# Patient Record
Sex: Female | Born: 1970 | Race: White | Hispanic: No | Marital: Married | Smoking: Never smoker
Health system: Southern US, Community
[De-identification: ages and names within clinical notes are randomized; demographics above are authoritative.]

## PROBLEM LIST (undated history)

## (undated) DIAGNOSIS — Z8489 Family history of other specified conditions: Secondary | ICD-10-CM

## (undated) DIAGNOSIS — J9859 Other diseases of mediastinum, not elsewhere classified: Secondary | ICD-10-CM

## (undated) DIAGNOSIS — M722 Plantar fascial fibromatosis: Secondary | ICD-10-CM

## (undated) DIAGNOSIS — I749 Embolism and thrombosis of unspecified artery: Secondary | ICD-10-CM

## (undated) DIAGNOSIS — L719 Rosacea, unspecified: Secondary | ICD-10-CM

## (undated) DIAGNOSIS — S62109A Fracture of unspecified carpal bone, unspecified wrist, initial encounter for closed fracture: Secondary | ICD-10-CM

## (undated) DIAGNOSIS — H8110 Benign paroxysmal vertigo, unspecified ear: Secondary | ICD-10-CM

## (undated) HISTORY — DX: Embolism and thrombosis of unspecified artery: I74.9

## (undated) HISTORY — PX: WISDOM TOOTH EXTRACTION: SHX21

## (undated) HISTORY — DX: Benign paroxysmal vertigo, unspecified ear: H81.10

## (undated) HISTORY — DX: Other diseases of mediastinum, not elsewhere classified: J98.59

## (undated) HISTORY — DX: Fracture of unspecified carpal bone, unspecified wrist, initial encounter for closed fracture: S62.109A

---

## 1998-04-23 ENCOUNTER — Inpatient Hospital Stay (HOSPITAL_COMMUNITY): Admission: AD | Admit: 1998-04-23 | Discharge: 1998-04-26 | Payer: Self-pay | Admitting: Obstetrics and Gynecology

## 2000-06-10 ENCOUNTER — Other Ambulatory Visit: Admission: RE | Admit: 2000-06-10 | Discharge: 2000-06-10 | Payer: Self-pay | Admitting: Obstetrics and Gynecology

## 2001-01-07 ENCOUNTER — Inpatient Hospital Stay (HOSPITAL_COMMUNITY): Admission: AD | Admit: 2001-01-07 | Discharge: 2001-01-10 | Payer: Self-pay | Admitting: Obstetrics and Gynecology

## 2001-02-10 ENCOUNTER — Other Ambulatory Visit: Admission: RE | Admit: 2001-02-10 | Discharge: 2001-02-10 | Payer: Self-pay | Admitting: Obstetrics and Gynecology

## 2003-03-16 ENCOUNTER — Other Ambulatory Visit: Admission: RE | Admit: 2003-03-16 | Discharge: 2003-03-16 | Payer: Self-pay | Admitting: Obstetrics & Gynecology

## 2006-07-10 ENCOUNTER — Encounter: Admission: RE | Admit: 2006-07-10 | Discharge: 2006-07-10 | Payer: Self-pay | Admitting: Internal Medicine

## 2010-05-10 ENCOUNTER — Encounter: Admission: RE | Admit: 2010-05-10 | Discharge: 2010-05-10 | Payer: Self-pay | Admitting: Internal Medicine

## 2014-08-29 ENCOUNTER — Other Ambulatory Visit (HOSPITAL_COMMUNITY): Payer: Self-pay | Admitting: Otolaryngology

## 2014-08-29 ENCOUNTER — Ambulatory Visit (HOSPITAL_COMMUNITY): Payer: Self-pay

## 2014-08-29 DIAGNOSIS — R52 Pain, unspecified: Secondary | ICD-10-CM

## 2015-06-26 ENCOUNTER — Other Ambulatory Visit: Payer: Self-pay | Admitting: Internal Medicine

## 2015-06-26 ENCOUNTER — Ambulatory Visit (HOSPITAL_COMMUNITY)
Admission: RE | Admit: 2015-06-26 | Discharge: 2015-06-26 | Disposition: A | Discharge status: Home / Self Care | Payer: 59 | Source: Ambulatory Visit | Attending: Surgery | Admitting: Surgery

## 2015-06-26 ENCOUNTER — Other Ambulatory Visit: Payer: Self-pay | Admitting: Surgery

## 2015-06-26 ENCOUNTER — Other Ambulatory Visit (HOSPITAL_COMMUNITY): Payer: Self-pay | Admitting: Internal Medicine

## 2015-06-26 DIAGNOSIS — I998 Other disorder of circulatory system: Secondary | ICD-10-CM

## 2015-06-26 DIAGNOSIS — I999 Unspecified disorder of circulatory system: Secondary | ICD-10-CM | POA: Diagnosis not present

## 2015-06-26 DIAGNOSIS — I744 Embolism and thrombosis of arteries of extremities, unspecified: Secondary | ICD-10-CM

## 2015-06-27 ENCOUNTER — Inpatient Hospital Stay
Admission: RE | Admit: 2015-06-27 | Discharge: 2015-06-27 | Disposition: A | Discharge status: Home / Self Care | Payer: 59 | Source: Ambulatory Visit | Attending: Internal Medicine | Admitting: Internal Medicine

## 2015-06-28 ENCOUNTER — Encounter: Payer: Self-pay | Admitting: Podiatry

## 2015-06-28 ENCOUNTER — Ambulatory Visit (INDEPENDENT_AMBULATORY_CARE_PROVIDER_SITE_OTHER): Payer: 59 | Admitting: Podiatry

## 2015-06-28 ENCOUNTER — Ambulatory Visit (INDEPENDENT_AMBULATORY_CARE_PROVIDER_SITE_OTHER): Payer: 59

## 2015-06-28 VITALS — BP 115/81 | HR 72 | Resp 16 | Ht 67.0 in | Wt 195.0 lb

## 2015-06-28 DIAGNOSIS — M722 Plantar fascial fibromatosis: Secondary | ICD-10-CM | POA: Diagnosis not present

## 2015-06-28 DIAGNOSIS — M79672 Pain in left foot: Secondary | ICD-10-CM

## 2015-06-28 DIAGNOSIS — M2010 Hallux valgus (acquired), unspecified foot: Secondary | ICD-10-CM | POA: Diagnosis not present

## 2015-06-28 DIAGNOSIS — Z0189 Encounter for other specified special examinations: Secondary | ICD-10-CM

## 2015-06-28 MED ORDER — TRIAMCINOLONE ACETONIDE 10 MG/ML IJ SUSP
10.0000 mg | Freq: Once | INTRAMUSCULAR | Status: AC
  Administered 2015-06-28: 10 mg

## 2015-06-28 MED ORDER — DICLOFENAC SODIUM 75 MG PO TBEC: 75.0000 mg | DELAYED_RELEASE_TABLET | Freq: Two times a day (BID) | ORAL | Status: DC

## 2015-06-28 NOTE — Progress Notes (Signed)
   Subjective:    Patient ID: Erica Browning, female    DOB: 11/26/1970, 44 y.o.   MRN: [id number]  HPI Patient presents with foot pain in their left foot, heel and arch. This has been going on for the past 3-4 weeks.  Patient also presents with needing new orthotics. Patient's old ones are worn out.    Review of Systems  All other systems reviewed and are negative.      Objective:   Physical Exam        Assessment & Plan:

## 2015-06-28 NOTE — Progress Notes (Signed)
Subjective:     Patient ID: Erica Browning, female   DOB: Feb 12, 1971, 44 y.o.   MRN: [id number]  HPI patient states that my left heel has been killing me for the last month and I admit I was very active and I did not wear proper shoes and I do have structural bunions bilateral but they're not bothering me significantly   Review of Systems  All other systems reviewed and are negative.      Objective:   Physical Exam  Constitutional: She is oriented to person, place, and time.  Cardiovascular: Intact distal pulses.   Musculoskeletal: Normal range of motion.  Neurological: She is oriented to person, place, and time.  Skin: Skin is warm.  Nursing note and vitals reviewed.  neurovascular status intact muscle strength adequate range of motion within normal limits with patient noted to have exquisite discomfort plantar aspect left heel at the insertional point tendon the calcaneus with inflammation and moderate depression of the arch. 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 At this point were to focus on the structural plantar fasciitis and I injected the plantar fascia 3 Milligan Kenalog 5 mill grams Xylocaine placed in fascially brace placed on for tear and 75 mg twice a day and instructed on physical therapy. We will schedule her for new orthotics but it next visit after I get the heel pain under control

## 2015-06-28 NOTE — Patient Instructions (Signed)

## 2015-06-29 ENCOUNTER — Other Ambulatory Visit: Payer: Self-pay

## 2015-06-29 ENCOUNTER — Other Ambulatory Visit (HOSPITAL_COMMUNITY): Payer: Self-pay | Admitting: Internal Medicine

## 2015-06-29 ENCOUNTER — Ambulatory Visit (HOSPITAL_COMMUNITY): Payer: 59 | Attending: Cardiology

## 2015-06-29 DIAGNOSIS — I744 Embolism and thrombosis of arteries of extremities, unspecified: Secondary | ICD-10-CM

## 2015-07-05 ENCOUNTER — Ambulatory Visit (INDEPENDENT_AMBULATORY_CARE_PROVIDER_SITE_OTHER): Payer: 59 | Admitting: Podiatry

## 2015-07-05 ENCOUNTER — Encounter: Payer: Self-pay | Admitting: Thoracic Surgery (Cardiothoracic Vascular Surgery)

## 2015-07-05 ENCOUNTER — Institutional Professional Consult (permissible substitution) (INDEPENDENT_AMBULATORY_CARE_PROVIDER_SITE_OTHER): Payer: 59 | Admitting: Thoracic Surgery (Cardiothoracic Vascular Surgery)

## 2015-07-05 VITALS — BP 145/90 | HR 74 | Resp 20 | Ht 67.0 in | Wt 195.0 lb

## 2015-07-05 VITALS — BP 119/66 | HR 72 | Resp 16

## 2015-07-05 DIAGNOSIS — D383 Neoplasm of uncertain behavior of mediastinum: Secondary | ICD-10-CM | POA: Diagnosis not present

## 2015-07-05 DIAGNOSIS — D4989 Neoplasm of unspecified behavior of other specified sites: Secondary | ICD-10-CM

## 2015-07-05 DIAGNOSIS — M2010 Hallux valgus (acquired), unspecified foot: Secondary | ICD-10-CM | POA: Diagnosis not present

## 2015-07-05 DIAGNOSIS — J9859 Other diseases of mediastinum, not elsewhere classified: Secondary | ICD-10-CM | POA: Insufficient documentation

## 2015-07-05 DIAGNOSIS — M722 Plantar fascial fibromatosis: Secondary | ICD-10-CM

## 2015-07-05 NOTE — Progress Notes (Signed)
PCP is Marton Redwood, MD Referring Provider is Marton Redwood, MD  Chief Complaint  Patient presents with  . Lung Mass    Surgical eval on lung mass, CTA Chest 06/28/15 @ [facility]    HPI: 44 year old woman sent for consultation regarding an anterior mediastinal mass.   Erica Browning is a 44 year old mother of 2 with no significant past medical history. She is very active and plays tennis on regular basis. About 2 weeks ago she had an episode where she noted that her right ring finger was blue in color and numb. This happened to her 2 nights in a row. She saw Dr. Brigitte Pulse who I did a workup on her including vascular studies which showed some compromise of flow. 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  In retrospect she says she has been having some chest discomfort. This is often positional, but never exertional. She describes it as a tight or stretching feeling in the chest. This is come and gone over the past couple of years. She is not having any exertional chest tightness, pressure or pain. She does not have any shortness of breath or wheezing. She has not had any weight loss.   Past Medical History  Diagnosis Date  . Mediastinal mass   . BPPV (benign paroxysmal positional vertigo)   . Fracture of wrist     as a child  . Arterial thrombosis     No past surgical history on file.  No family history on file. family history significant for cancer in mother and uncle.  Social History Social History  Substance Use Topics  . Smoking status: Never Smoker   . Smokeless tobacco: None  . Alcohol Use: 0.0 oz/week    0 Standard drinks or equivalent per week     Comment: social    Current Outpatient Prescriptions  Medication Sig Dispense Refill  . aspirin 81 MG tablet Take 81 mg by mouth daily.    . diclofenac (VOLTAREN) 75 MG EC tablet Take 1 tablet (75 mg total) by mouth 2 (two) times daily.  50 tablet 2  . ferrous sulfate 325 (65 FE) MG tablet Take 325 mg by mouth daily with breakfast.    . METRONIDAZOLE, TOPICAL, 0.75 % LOTN apply to [facility] once daily as directed  0  . Sulfacetamide Sodium-Sulfur 9.8-4.8 % LIQD   0   No current facility-administered medications for this visit.    No Known Allergies  Review of Systems  Constitutional: Negative for fever, chills, diaphoresis, activity change, appetite change, fatigue and unexpected weight change.  Eyes: Negative.   Respiratory: Negative for shortness of breath and wheezing.   Cardiovascular: Positive for chest pain (vague left anterior). Negative for leg swelling.  Gastrointestinal: Negative for abdominal pain and blood in stool.  Genitourinary: Positive for menstrual problem (heavy menses). Negative for hematuria and difficulty urinating.  Musculoskeletal: Negative for arthralgias, gait problem and neck pain.  Skin: Positive for color change (left ring finger "blue" resolved).  Neurological: Positive for numbness (ring finger right hand for 2 nights last week). Negative for dizziness and syncope.  Hematological: Negative for adenopathy. Does not bruise/bleed easily.    BP 145/90 mmHg  Pulse 74  Resp 20  Ht 5\' 7"  (1.702 m)  Wt 195 lb (88.451 kg)  BMI 30.53 kg/m2  SpO2 98%  LMP 06/07/2015 Physical Exam  Constitutional: She is oriented to person,  place, and time. She appears well-developed and well-nourished. No distress.  HENT:  Head: Normocephalic and atraumatic.  Eyes: Conjunctivae and EOM are normal. No scleral icterus.  Neck: Neck supple. No thyromegaly present.  Cardiovascular: Normal rate, regular rhythm and intact distal pulses.  Exam reveals no gallop and no friction rub.   No murmur heard. Pulmonary/Chest: Effort normal and breath sounds normal. No respiratory distress. She has no wheezes. She has no rales.  Abdominal: Soft. Bowel sounds are normal. She exhibits no distension. There is no tenderness.   Musculoskeletal: Normal range of motion. She exhibits no edema or tenderness.  Lymphadenopathy:    She has no cervical adenopathy.  Neurological: She is alert and oriented to person, place, and time. No cranial nerve deficit. She exhibits normal muscle tone. Coordination normal.  Skin: Skin is warm and dry. No rash noted. No erythema.  Vitals reviewed.    Diagnostic Tests:  Beta hCG < 1 Alpha-fetoprotein = 11.9 (0.0-8.3)  CT chest [facility] 06/28/2015 Impression No pulmonary embolus. Dominantly fatty lesion in the left anterior mediastinum was some wispy soft tissue component, they represented teratoma. Differential considerations include lipoma/liposarcoma and thymoma lipoma. Axial images of the right upper extremity were obtained. The study was not tightened appropriately to evaluate the arterial structures of the arm.  Echocardiogram Study conclusions Left ventricle: Cavity size was normal. Systolic function was normal. The estimated ejection fraction was in the range of 60-65%. Wall motion was normal; there were no regional wall motion abnormalities. Left trigger diastolic function parameters were normal. Impressions No cardiac source of emboli was identified.  Vascular study Decreased phasicity in the right fourth finger in comparison to the left suggestive of a possible ischemic finger.  I personally reviewed the CT chest images and lab results. I concur with the radiologist's findings regarding the intermediate style mass. 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 Very slightly elevated alpha-fetoprotein levels are also consistent with teratoma.  Other possible things in the differential diagnosis include seminomatous or nonseminomatous germ cell tumors, lipoma, liposarcoma, thymoma. These are all much less likely than teratoma. I  discussed the differential diagnosis with Erica Browning and we reviewed the films together.  I recommended that we resect this mass. This would best be done through a left VATS approach. The primary limiting factor is going to be getting the mass out of the chest, but otherwise we should be able to do the procedure through a relatively small incision. I reviewed the general nature of the procedure with Erica Browning and her husband. We discussed the need for general anesthesia, the incisions to be used, the expected hospital stay, and the overall recovery. I reviewed the indications, risks, benefits, and alternatives. She understands the risks include, but are not limited to death (extremely unlikely), MI, DVT, PE, bleeding, possible need for transfusion, infection, phrenic nerve injury with diaphragmatic dysfunction, and chronic pain or paresthesias. They also understand there is a possibility of other unforeseeable complications. Overall she is a very low risk patient.  Plan: Left VATS, resection anterior mediastinal mass, On-Q local anesthetic catheter placement.  She will call to schedule.  I spent 45 minutes with Erica Browning during this visit, greater than 50% was spent in counseling.  [name], MD [facility] [phone]

## 2015-07-05 NOTE — Progress Notes (Signed)
Subjective:     Patient ID: Erica Browning, female   DOB: Dec 21, 1970, 44 y.o.   MRN: [id number]  HPI patient states she played tennis yesterday and her heel really started to hurt her but it's been some better over this week. Worse in the morning after sitting and she is due today to see a cardio surgeon for a mass around her heart   Review of Systems     Objective:   Physical Exam    neurovascular status intact with patient noted to have continued discomfort plantar aspect left heel insertional point tendon the calcaneus with moderate depression of the arch noted  Assessment:     Continued plantar fasciitis left acute nature especially after periods of sitting or after sleeping    Plan:     Reviewed condition and did dispensed night splint as were not to do an injection today until her other areas are evaluated thoroughly. I also scanned for custom orthotics for the long-term for this patient and she'll be seen back when they're ready or earlier if any issues should occur

## 2015-07-06 ENCOUNTER — Other Ambulatory Visit: Payer: Self-pay | Admitting: *Deleted

## 2015-07-06 DIAGNOSIS — J9859 Other diseases of mediastinum, not elsewhere classified: Secondary | ICD-10-CM

## 2015-07-19 NOTE — Pre-Procedure Instructions (Signed)
Erica Browning  07/19/2015     Your procedure is scheduled on : Monday July 24, 2015 at 7:30 AM.  Report to [facility] Admitting at 5:30 A.M.  Call this number if you have problems the morning of surgery: [phone]   Remember:  Do not eat food or drink liquids after midnight.  Take these medicines the morning of surgery with A SIP OF WATER: NONE   Please stop taking any vitamins, herbal medications, Diclofenac/Voltaren, Ibuprofen, Advil, Motrin, Aleve, etc   Please bring your advanced directive the day of surgery    Do not wear jewelry, make-up or nail polish.  Do not wear lotions, powders, perfumes, or deodorant.    Do not shave 48 hours prior to surgery.   Do not bring valuables to the hospital.  [facility] is not responsible for any belongings or valuables.  Contacts, dentures or bridgework may not be worn into surgery.  Leave your suitcase in the car.  After surgery it may be brought to your room.  For patients admitted to the hospital, discharge time will be determined by your treatment team.  Patients discharged the day of surgery will not be allowed to drive home.   Name and phone number of your driver:    Special instructions:  Shower using CHG soap the night before and the morning of your surgery  Please read over the following fact sheets that you were given. Pain Booklet, Coughing and Deep Breathing, Blood Transfusion Information, MRSA Information and Surgical Site Infection Prevention

## 2015-07-20 ENCOUNTER — Encounter (HOSPITAL_COMMUNITY)
Admission: RE | Admit: 2015-07-20 | Discharge: 2015-07-20 | Disposition: A | Discharge status: Home / Self Care | Payer: 59 | Source: Ambulatory Visit | Attending: Thoracic Surgery (Cardiothoracic Vascular Surgery) | Admitting: Thoracic Surgery (Cardiothoracic Vascular Surgery)

## 2015-07-20 ENCOUNTER — Encounter (HOSPITAL_COMMUNITY): Payer: Self-pay

## 2015-07-20 ENCOUNTER — Ambulatory Visit: Payer: 59 | Admitting: *Deleted

## 2015-07-20 ENCOUNTER — Other Ambulatory Visit: Payer: Self-pay

## 2015-07-20 VITALS — BP 130/80 | HR 70 | Temp 98.0°F | Resp 20 | Ht 67.0 in | Wt 195.8 lb

## 2015-07-20 DIAGNOSIS — Z01812 Encounter for preprocedural laboratory examination: Secondary | ICD-10-CM | POA: Diagnosis not present

## 2015-07-20 DIAGNOSIS — Z7901 Long term (current) use of anticoagulants: Secondary | ICD-10-CM | POA: Insufficient documentation

## 2015-07-20 DIAGNOSIS — Z01818 Encounter for other preprocedural examination: Secondary | ICD-10-CM | POA: Insufficient documentation

## 2015-07-20 DIAGNOSIS — R222 Localized swelling, mass and lump, trunk: Secondary | ICD-10-CM | POA: Insufficient documentation

## 2015-07-20 DIAGNOSIS — Z79899 Other long term (current) drug therapy: Secondary | ICD-10-CM | POA: Diagnosis not present

## 2015-07-20 DIAGNOSIS — M722 Plantar fascial fibromatosis: Secondary | ICD-10-CM

## 2015-07-20 DIAGNOSIS — J9859 Other diseases of mediastinum, not elsewhere classified: Secondary | ICD-10-CM

## 2015-07-20 DIAGNOSIS — Z0183 Encounter for blood typing: Secondary | ICD-10-CM | POA: Diagnosis not present

## 2015-07-20 DIAGNOSIS — R7989 Other specified abnormal findings of blood chemistry: Secondary | ICD-10-CM | POA: Insufficient documentation

## 2015-07-20 HISTORY — DX: Family history of other specified conditions: Z84.89

## 2015-07-20 HISTORY — DX: Plantar fascial fibromatosis: M72.2

## 2015-07-20 HISTORY — DX: Rosacea, unspecified: L71.9

## 2015-07-20 LAB — BLOOD GAS, ARTERIAL
ACID-BASE DEFICIT: 0.8 mmol/L (ref 0.0–2.0)
Bicarbonate: 22.7 mEq/L (ref 20.0–24.0)
Drawn by: 206361
FIO2: 0.21
O2 Saturation: 97.7 %
PCO2 ART: 33.7 mmHg — AB (ref 35.0–45.0)
PO2 ART: 97.2 mmHg (ref 80.0–100.0)
Patient temperature: 98.6
TCO2: 23.7 mmol/L (ref 0–100)
pH, Arterial: 7.444 (ref 7.350–7.450)

## 2015-07-20 LAB — CBC
HCT: 40 % (ref 36.0–46.0)
HEMOGLOBIN: 13.6 g/dL (ref 12.0–15.0)
MCH: 30.5 pg (ref 26.0–34.0)
MCHC: 34 g/dL (ref 30.0–36.0)
MCV: 89.7 fL (ref 78.0–100.0)
PLATELETS: 257 10*3/uL (ref 150–400)
RBC: 4.46 MIL/uL (ref 3.87–5.11)
RDW: 13.2 % (ref 11.5–15.5)
WBC: 4.5 10*3/uL (ref 4.0–10.5)

## 2015-07-20 LAB — COMPREHENSIVE METABOLIC PANEL
ALK PHOS: 70 U/L (ref 38–126)
ALT: 165 U/L — AB (ref 14–54)
ANION GAP: 6 (ref 5–15)
AST: 136 U/L — ABNORMAL HIGH (ref 15–41)
Albumin: 3.6 g/dL (ref 3.5–5.0)
BUN: 9 mg/dL (ref 6–20)
CHLORIDE: 108 mmol/L (ref 101–111)
CO2: 23 mmol/L (ref 22–32)
Calcium: 9.1 mg/dL (ref 8.9–10.3)
Creatinine, Ser: 0.6 mg/dL (ref 0.44–1.00)
GLUCOSE: 93 mg/dL (ref 65–99)
POTASSIUM: 4 mmol/L (ref 3.5–5.1)
Sodium: 137 mmol/L (ref 135–145)
Total Bilirubin: 0.3 mg/dL (ref 0.3–1.2)
Total Protein: 7 g/dL (ref 6.5–8.1)

## 2015-07-20 LAB — URINALYSIS, ROUTINE W REFLEX MICROSCOPIC
BILIRUBIN URINE: NEGATIVE
Glucose, UA: NEGATIVE mg/dL
HGB URINE DIPSTICK: NEGATIVE
KETONES UR: NEGATIVE mg/dL
NITRITE: NEGATIVE
PROTEIN: NEGATIVE mg/dL
SPECIFIC GRAVITY, URINE: 1.011 (ref 1.005–1.030)
UROBILINOGEN UA: 0.2 mg/dL (ref 0.0–1.0)
pH: 6.5 (ref 5.0–8.0)

## 2015-07-20 LAB — URINE MICROSCOPIC-ADD ON

## 2015-07-20 LAB — PROTIME-INR
INR: 1.03 (ref 0.00–1.49)
Prothrombin Time: 13.7 seconds (ref 11.6–15.2)

## 2015-07-20 LAB — SURGICAL PCR SCREEN
MRSA, PCR: NEGATIVE
Staphylococcus aureus: NEGATIVE

## 2015-07-20 LAB — ABO/RH: ABO/RH(D): O POS

## 2015-07-20 LAB — TYPE AND SCREEN
ABO/RH(D): O POS
ANTIBODY SCREEN: NEGATIVE

## 2015-07-20 LAB — APTT: APTT: 54 s — AB (ref 24–37)

## 2015-07-20 LAB — HCG, SERUM, QUALITATIVE: PREG SERUM: NEGATIVE

## 2015-07-20 NOTE — Progress Notes (Signed)
Patient ID: Erica Browning, female   DOB: 1970-12-17, 44 y.o.   MRN: [id number] Patient presents for orthotic pick up.  Verbal and written break in and wear instructions given.  Patient will follow up in 4 weeks if symptoms worsen or fail to improve.

## 2015-07-20 NOTE — Progress Notes (Signed)
Anesthesia Chart Review:  Pt is 44 year old female scheduled for video assisted thoracoscopy, resection of mediastinal mass (L chest) on 07/24/2015 with Dr. Roxan Hockey.   PMH includes: BPPV, arterial thrombosis. Never smoker. BMI 31.   Medications include: lovenox, voltaren  Preoperative labs reviewed.  PTT 54. AST 136, ALT 165.   EKG 07/20/2015: NSR. Possible LAE.   Echo 06/29/2015:  - Left ventricle: The cavity size was normal. Systolic function was normal. The estimated ejection fraction was in the range of 60% to 65%. Wall motion was normal; there were no regional wall motion abnormalities. Left ventricular diastolic function parameters were normal. -Impressions: No cardiac source of emboli was indentified.  Reviewed case with Dr. Tobias Alexander. Will repeat LFTs DOS. Left voicemail for Ryan in Dr. Leonarda Salon office to let them know about the elevated LFTs and PTT.  If labs acceptable DOS, I anticipate pt can proceed as scheduled.   Willeen Cass, FNP-BC [name]/Anesthesiology Phone: [phone] 07/20/2015 3:24 PM

## 2015-07-20 NOTE — Progress Notes (Signed)
PCP is Marton Redwood  Patient informed Nurse that she started Lovenox injections on 07/13/15 BID. Patient voiced concern about if she needed to stop taking Lovenox before surgery. Nurse called Thurmond Butts at Dr. Leonarda Salon office and Thurmond Butts stated she would inform Dr. Roxan Hockey of this and she would call patient today and let her know if/when she needed to stop taking Lovenox injections. Patient informed of this and verbalized understanding.

## 2015-07-20 NOTE — Patient Instructions (Signed)

## 2015-07-24 ENCOUNTER — Inpatient Hospital Stay (HOSPITAL_COMMUNITY): Payer: 59

## 2015-07-24 ENCOUNTER — Encounter (HOSPITAL_COMMUNITY): Payer: Self-pay | Admitting: Certified Registered"

## 2015-07-24 ENCOUNTER — Inpatient Hospital Stay (HOSPITAL_COMMUNITY)
Admission: RE | Admit: 2015-07-24 | Discharge: 2015-07-28 | DRG: 164 | Disposition: A | Discharge status: Home / Self Care | Payer: 59 | Source: Ambulatory Visit | Attending: Thoracic Surgery (Cardiothoracic Vascular Surgery) | Admitting: Thoracic Surgery (Cardiothoracic Vascular Surgery)

## 2015-07-24 ENCOUNTER — Inpatient Hospital Stay (HOSPITAL_COMMUNITY): Payer: 59 | Admitting: Anesthesiology

## 2015-07-24 ENCOUNTER — Encounter (HOSPITAL_COMMUNITY)
Admission: RE | Disposition: A | Discharge status: Home / Self Care | Payer: Self-pay | Source: Ambulatory Visit | Attending: Thoracic Surgery (Cardiothoracic Vascular Surgery)

## 2015-07-24 ENCOUNTER — Inpatient Hospital Stay (HOSPITAL_COMMUNITY): Payer: 59 | Admitting: Emergency Medicine

## 2015-07-24 DIAGNOSIS — J9859 Other diseases of mediastinum, not elsewhere classified: Secondary | ICD-10-CM | POA: Diagnosis present

## 2015-07-24 DIAGNOSIS — Z01818 Encounter for other preprocedural examination: Secondary | ICD-10-CM

## 2015-07-24 DIAGNOSIS — Z4682 Encounter for fitting and adjustment of non-vascular catheter: Secondary | ICD-10-CM

## 2015-07-24 DIAGNOSIS — Z9889 Other specified postprocedural states: Secondary | ICD-10-CM

## 2015-07-24 DIAGNOSIS — Z7982 Long term (current) use of aspirin: Secondary | ICD-10-CM

## 2015-07-24 DIAGNOSIS — R222 Localized swelling, mass and lump, trunk: Secondary | ICD-10-CM | POA: Diagnosis present

## 2015-07-24 DIAGNOSIS — D489 Neoplasm of uncertain behavior, unspecified: Secondary | ICD-10-CM

## 2015-07-24 DIAGNOSIS — D62 Acute posthemorrhagic anemia: Secondary | ICD-10-CM | POA: Diagnosis not present

## 2015-07-24 DIAGNOSIS — D152 Benign neoplasm of mediastinum: Secondary | ICD-10-CM | POA: Diagnosis present

## 2015-07-24 DIAGNOSIS — E876 Hypokalemia: Secondary | ICD-10-CM | POA: Diagnosis not present

## 2015-07-24 DIAGNOSIS — K5900 Constipation, unspecified: Secondary | ICD-10-CM | POA: Diagnosis not present

## 2015-07-24 DIAGNOSIS — D4989 Neoplasm of unspecified behavior of other specified sites: Secondary | ICD-10-CM

## 2015-07-24 HISTORY — PX: RESECTION OF MEDIASTINAL MASS: SHX6497

## 2015-07-24 HISTORY — PX: VIDEO ASSISTED THORACOSCOPY: SHX5073

## 2015-07-24 LAB — HEPATIC FUNCTION PANEL
ALBUMIN: 3.7 g/dL (ref 3.5–5.0)
ALK PHOS: 82 U/L (ref 38–126)
ALT: 189 U/L — ABNORMAL HIGH (ref 14–54)
AST: 80 U/L — ABNORMAL HIGH (ref 15–41)
BILIRUBIN TOTAL: 0.7 mg/dL (ref 0.3–1.2)
Bilirubin, Direct: <0.1 mg/dL — ABNORMAL LOW (ref 0.1–0.5)
TOTAL PROTEIN: 7.3 g/dL (ref 6.5–8.1)

## 2015-07-24 LAB — APTT: APTT: 34 s (ref 24–37)

## 2015-07-24 SURGERY — VIDEO ASSISTED THORACOSCOPY
Anesthesia: General | Site: Chest

## 2015-07-24 MED ORDER — DICLOFENAC SODIUM 75 MG PO TBEC
75.0000 mg | DELAYED_RELEASE_TABLET | Freq: Two times a day (BID) | ORAL | Status: DC
  Filled 2015-07-24 (×2): qty 1

## 2015-07-24 MED ORDER — SUCCINYLCHOLINE CHLORIDE 20 MG/ML IJ SOLN
INTRAMUSCULAR | Status: DC | PRN
  Administered 2015-07-24: 60 mg via INTRAVENOUS

## 2015-07-24 MED ORDER — OXYCODONE HCL 5 MG PO TABS: 5.0000 mg | ORAL_TABLET | Freq: Once | ORAL | Status: DC | PRN

## 2015-07-24 MED ORDER — DEXTROSE 5 % IV SOLN
1.5000 g | Freq: Two times a day (BID) | INTRAVENOUS | Status: AC
  Administered 2015-07-24 – 2015-07-25 (×2): 1.5 g via INTRAVENOUS
  Filled 2015-07-24 (×2): qty 1.5

## 2015-07-24 MED ORDER — SENNOSIDES-DOCUSATE SODIUM 8.6-50 MG PO TABS
1.0000 | ORAL_TABLET | Freq: Every day | ORAL | Status: DC
  Administered 2015-07-24 – 2015-07-27 (×4): 1 via ORAL
  Filled 2015-07-24 (×5): qty 1

## 2015-07-24 MED ORDER — VECURONIUM BROMIDE 10 MG IV SOLR
INTRAVENOUS | Status: DC | PRN
  Administered 2015-07-24 (×2): 1 mg via INTRAVENOUS
  Administered 2015-07-24: 2 mg via INTRAVENOUS

## 2015-07-24 MED ORDER — FENTANYL 10 MCG/ML IV SOLN
INTRAVENOUS | Status: DC
  Administered 2015-07-24: 15:00:00 via INTRAVENOUS
  Administered 2015-07-24: 255 ug via INTRAVENOUS
  Administered 2015-07-24: 105 ug via INTRAVENOUS
  Administered 2015-07-24: 120 ug via INTRAVENOUS
  Administered 2015-07-24: 23:00:00 via INTRAVENOUS
  Administered 2015-07-25: 165 ug via INTRAVENOUS
  Administered 2015-07-25: 180 ug via INTRAVENOUS
  Administered 2015-07-25 (×3): 90 ug via INTRAVENOUS
  Administered 2015-07-26: 0 ug via INTRAVENOUS
  Administered 2015-07-26: 1 ug via INTRAVENOUS
  Administered 2015-07-26: 30 ug via INTRAVENOUS
  Administered 2015-07-27: 0 ug via INTRAVENOUS
  Filled 2015-07-24 (×3): qty 50

## 2015-07-24 MED ORDER — DIPHENHYDRAMINE HCL 12.5 MG/5ML PO ELIX
12.5000 mg | ORAL_SOLUTION | Freq: Four times a day (QID) | ORAL | Status: DC | PRN
  Filled 2015-07-24: qty 5

## 2015-07-24 MED ORDER — SODIUM CHLORIDE 0.9 % IJ SOLN
INTRAMUSCULAR | Status: AC
  Filled 2015-07-24: qty 10

## 2015-07-24 MED ORDER — HYDROMORPHONE HCL 1 MG/ML IJ SOLN
INTRAMUSCULAR | Status: AC
  Administered 2015-07-24: 0.5 mg via INTRAVENOUS
  Filled 2015-07-24: qty 1

## 2015-07-24 MED ORDER — MIDAZOLAM HCL 2 MG/2ML IJ SOLN
INTRAMUSCULAR | Status: AC
  Filled 2015-07-24: qty 4

## 2015-07-24 MED ORDER — ONDANSETRON HCL 4 MG/2ML IJ SOLN: 4.0000 mg | Freq: Four times a day (QID) | INTRAMUSCULAR | Status: DC | PRN

## 2015-07-24 MED ORDER — GLYCOPYRROLATE 0.2 MG/ML IJ SOLN
INTRAMUSCULAR | Status: DC | PRN
  Administered 2015-07-24: .6 mg via INTRAVENOUS

## 2015-07-24 MED ORDER — SODIUM CHLORIDE 0.9 % IJ SOLN: 9.0000 mL | INTRAMUSCULAR | Status: DC | PRN

## 2015-07-24 MED ORDER — NEOSTIGMINE METHYLSULFATE 10 MG/10ML IV SOLN
INTRAVENOUS | Status: DC | PRN
  Administered 2015-07-24: 4 mg via INTRAVENOUS

## 2015-07-24 MED ORDER — ONDANSETRON HCL 4 MG/2ML IJ SOLN
INTRAMUSCULAR | Status: AC
  Filled 2015-07-24: qty 2

## 2015-07-24 MED ORDER — DEXTROSE 5 % IV SOLN
1.5000 g | Freq: Once | INTRAVENOUS | Status: DC
  Filled 2015-07-24: qty 1.5

## 2015-07-24 MED ORDER — PROPOFOL 10 MG/ML IV BOLUS
INTRAVENOUS | Status: AC
  Filled 2015-07-24: qty 20

## 2015-07-24 MED ORDER — ACETAMINOPHEN 500 MG PO TABS
1000.0000 mg | ORAL_TABLET | Freq: Four times a day (QID) | ORAL | Status: DC
  Administered 2015-07-24 – 2015-07-28 (×12): 1000 mg via ORAL
  Filled 2015-07-24 (×22): qty 2

## 2015-07-24 MED ORDER — METOCLOPRAMIDE HCL 5 MG/ML IJ SOLN
10.0000 mg | Freq: Four times a day (QID) | INTRAMUSCULAR | Status: AC
  Administered 2015-07-24 – 2015-07-25 (×2): 10 mg via INTRAVENOUS
  Filled 2015-07-24 (×5): qty 2

## 2015-07-24 MED ORDER — GLYCOPYRROLATE 0.2 MG/ML IJ SOLN
INTRAMUSCULAR | Status: AC
  Filled 2015-07-24: qty 3

## 2015-07-24 MED ORDER — PROPOFOL 10 MG/ML IV BOLUS
INTRAVENOUS | Status: DC | PRN
  Administered 2015-07-24: 100 mg via INTRAVENOUS
  Administered 2015-07-24: 200 mg via INTRAVENOUS
  Administered 2015-07-24: 40 mg via INTRAVENOUS
  Administered 2015-07-24: 50 mg via INTRAVENOUS

## 2015-07-24 MED ORDER — VECURONIUM BROMIDE 10 MG IV SOLR
INTRAVENOUS | Status: AC
  Filled 2015-07-24: qty 10

## 2015-07-24 MED ORDER — HEMOSTATIC AGENTS (NO CHARGE) OPTIME
TOPICAL | Status: DC | PRN
  Administered 2015-07-24: 1 via TOPICAL

## 2015-07-24 MED ORDER — LEVALBUTEROL HCL 0.63 MG/3ML IN NEBU
0.6300 mg | INHALATION_SOLUTION | Freq: Four times a day (QID) | RESPIRATORY_TRACT | Status: DC
  Administered 2015-07-24: 0.63 mg via RESPIRATORY_TRACT
  Filled 2015-07-24: qty 3

## 2015-07-24 MED ORDER — 0.9 % SODIUM CHLORIDE (POUR BTL) OPTIME
TOPICAL | Status: DC | PRN
  Administered 2015-07-24 (×2): 1000 mL

## 2015-07-24 MED ORDER — POTASSIUM CHLORIDE IN NACL 20-0.9 MEQ/L-% IV SOLN
INTRAVENOUS | Status: DC
  Administered 2015-07-24: 100 mL/h via INTRAVENOUS
  Administered 2015-07-25: 05:00:00 via INTRAVENOUS
  Filled 2015-07-24 (×4): qty 1000

## 2015-07-24 MED ORDER — ROCURONIUM BROMIDE 50 MG/5ML IV SOLN
INTRAVENOUS | Status: AC
  Filled 2015-07-24: qty 1

## 2015-07-24 MED ORDER — POTASSIUM CHLORIDE 10 MEQ/50ML IV SOLN
10.0000 meq | Freq: Every day | INTRAVENOUS | Status: DC | PRN
  Administered 2015-07-25 – 2015-07-26 (×3): 10 meq via INTRAVENOUS
  Filled 2015-07-24 (×3): qty 50

## 2015-07-24 MED ORDER — ACETAMINOPHEN 160 MG/5ML PO SOLN
1000.0000 mg | Freq: Four times a day (QID) | ORAL | Status: DC
  Administered 2015-07-27 (×2): 1000 mg via ORAL
  Filled 2015-07-24 (×19): qty 40

## 2015-07-24 MED ORDER — BUPIVACAINE 0.5 % ON-Q PUMP SINGLE CATH 400 ML
INJECTION | Status: DC | PRN
  Administered 2015-07-24: 400 mL

## 2015-07-24 MED ORDER — PHENYLEPHRINE HCL 10 MG/ML IJ SOLN
INTRAMUSCULAR | Status: DC | PRN
  Administered 2015-07-24: 40 ug via INTRAVENOUS
  Administered 2015-07-24: 80 ug via INTRAVENOUS

## 2015-07-24 MED ORDER — LIDOCAINE HCL (CARDIAC) 20 MG/ML IV SOLN
INTRAVENOUS | Status: AC
  Filled 2015-07-24: qty 5

## 2015-07-24 MED ORDER — OXYCODONE HCL 5 MG PO TABS
5.0000 mg | ORAL_TABLET | ORAL | Status: DC | PRN
  Administered 2015-07-24 – 2015-07-27 (×8): 10 mg via ORAL
  Filled 2015-07-24 (×8): qty 2

## 2015-07-24 MED ORDER — NEOSTIGMINE METHYLSULFATE 10 MG/10ML IV SOLN
INTRAVENOUS | Status: AC
  Filled 2015-07-24: qty 1

## 2015-07-24 MED ORDER — KETOROLAC TROMETHAMINE 15 MG/ML IJ SOLN
15.0000 mg | Freq: Four times a day (QID) | INTRAMUSCULAR | Status: AC
  Administered 2015-07-24 – 2015-07-26 (×8): 15 mg via INTRAVENOUS
  Filled 2015-07-24 (×9): qty 1

## 2015-07-24 MED ORDER — DEXTROSE 5 % IV SOLN
10.0000 mg | INTRAVENOUS | Status: DC | PRN
  Administered 2015-07-24: 20 ug/min via INTRAVENOUS

## 2015-07-24 MED ORDER — BUPIVACAINE 0.5 % ON-Q PUMP SINGLE CATH 400 ML
400.0000 mL | INJECTION | Status: DC
  Filled 2015-07-24 (×2): qty 400

## 2015-07-24 MED ORDER — FENTANYL CITRATE (PF) 250 MCG/5ML IJ SOLN
INTRAMUSCULAR | Status: AC
  Filled 2015-07-24: qty 5

## 2015-07-24 MED ORDER — LEVALBUTEROL HCL 0.63 MG/3ML IN NEBU
0.6300 mg | INHALATION_SOLUTION | Freq: Three times a day (TID) | RESPIRATORY_TRACT | Status: DC
Start: 2015-07-25 — End: 2015-07-25
  Filled 2015-07-24 (×3): qty 3

## 2015-07-24 MED ORDER — DIPHENHYDRAMINE HCL 50 MG/ML IJ SOLN: 12.5000 mg | Freq: Four times a day (QID) | INTRAMUSCULAR | Status: DC | PRN

## 2015-07-24 MED ORDER — SUCCINYLCHOLINE CHLORIDE 20 MG/ML IJ SOLN
INTRAMUSCULAR | Status: AC
  Filled 2015-07-24: qty 1

## 2015-07-24 MED ORDER — OXYCODONE HCL 5 MG/5ML PO SOLN: 5.0000 mg | Freq: Once | ORAL | Status: DC | PRN

## 2015-07-24 MED ORDER — MEPERIDINE HCL 25 MG/ML IJ SOLN: 6.2500 mg | INTRAMUSCULAR | Status: DC | PRN

## 2015-07-24 MED ORDER — PHENYLEPHRINE 40 MCG/ML (10ML) SYRINGE FOR IV PUSH (FOR BLOOD PRESSURE SUPPORT)
PREFILLED_SYRINGE | INTRAVENOUS | Status: AC
  Filled 2015-07-24: qty 10

## 2015-07-24 MED ORDER — BUPIVACAINE HCL (PF) 0.5 % IJ SOLN
INTRAMUSCULAR | Status: DC | PRN
  Administered 2015-07-24: 30 mL

## 2015-07-24 MED ORDER — TRAMADOL HCL 50 MG PO TABS
50.0000 mg | ORAL_TABLET | Freq: Four times a day (QID) | ORAL | Status: DC | PRN
  Administered 2015-07-26: 100 mg via ORAL
  Filled 2015-07-24: qty 2

## 2015-07-24 MED ORDER — NALOXONE HCL 0.4 MG/ML IJ SOLN: 0.4000 mg | INTRAMUSCULAR | Status: DC | PRN

## 2015-07-24 MED ORDER — MIDAZOLAM HCL 5 MG/5ML IJ SOLN
INTRAMUSCULAR | Status: DC | PRN
  Administered 2015-07-24: 2 mg via INTRAVENOUS

## 2015-07-24 MED ORDER — EPHEDRINE SULFATE 50 MG/ML IJ SOLN
INTRAMUSCULAR | Status: AC
  Filled 2015-07-24: qty 1

## 2015-07-24 MED ORDER — DEXTROSE 5 % IV SOLN
1.5000 g | INTRAVENOUS | Status: AC
  Administered 2015-07-24 (×2): 1.5 g via INTRAVENOUS

## 2015-07-24 MED ORDER — BISACODYL 5 MG PO TBEC
10.0000 mg | DELAYED_RELEASE_TABLET | Freq: Every day | ORAL | Status: DC
  Administered 2015-07-24 – 2015-07-28 (×5): 10 mg via ORAL
  Filled 2015-07-24 (×4): qty 2

## 2015-07-24 MED ORDER — FENTANYL CITRATE (PF) 250 MCG/5ML IJ SOLN
INTRAMUSCULAR | Status: DC | PRN
  Administered 2015-07-24 (×2): 50 ug via INTRAVENOUS
  Administered 2015-07-24: 150 ug via INTRAVENOUS
  Administered 2015-07-24: 100 ug via INTRAVENOUS
  Administered 2015-07-24: 25 ug via INTRAVENOUS
  Administered 2015-07-24: 100 ug via INTRAVENOUS
  Administered 2015-07-24: 25 ug via INTRAVENOUS

## 2015-07-24 MED ORDER — FERROUS SULFATE 325 (65 FE) MG PO TABS
325.0000 mg | ORAL_TABLET | Freq: Every day | ORAL | Status: DC
  Administered 2015-07-25 – 2015-07-28 (×4): 325 mg via ORAL
  Filled 2015-07-24 (×7): qty 1

## 2015-07-24 MED ORDER — ROCURONIUM BROMIDE 100 MG/10ML IV SOLN
INTRAVENOUS | Status: DC | PRN
  Administered 2015-07-24: 20 mg via INTRAVENOUS
  Administered 2015-07-24: 10 mg via INTRAVENOUS
  Administered 2015-07-24: 30 mg via INTRAVENOUS
  Administered 2015-07-24: 10 mg via INTRAVENOUS
  Administered 2015-07-24: 30 mg via INTRAVENOUS

## 2015-07-24 MED ORDER — HYDROMORPHONE HCL 1 MG/ML IJ SOLN
0.2500 mg | INTRAMUSCULAR | Status: DC | PRN
  Administered 2015-07-24 (×4): 0.5 mg via INTRAVENOUS

## 2015-07-24 MED ORDER — BUPIVACAINE HCL (PF) 0.5 % IJ SOLN
INTRAMUSCULAR | Status: AC
  Filled 2015-07-24: qty 30

## 2015-07-24 MED ORDER — SULFACETAMIDE SODIUM-SULFUR 9.8-4.8 % EX LIQD: 1.0000 "application " | Freq: Every day | CUTANEOUS | Status: DC

## 2015-07-24 MED ORDER — DEXTROSE 5 % IV SOLN
INTRAVENOUS | Status: AC
  Filled 2015-07-24: qty 1.5

## 2015-07-24 MED ORDER — OXYCODONE HCL 5 MG PO TABS
ORAL_TABLET | ORAL | Status: AC
  Administered 2015-07-24: 10 mg via ORAL
  Filled 2015-07-24: qty 2

## 2015-07-24 MED ORDER — ONDANSETRON HCL 4 MG/2ML IJ SOLN
INTRAMUSCULAR | Status: DC | PRN
  Administered 2015-07-24: 4 mg via INTRAVENOUS

## 2015-07-24 MED ORDER — LACTATED RINGERS IV SOLN
INTRAVENOUS | Status: DC | PRN
  Administered 2015-07-24 (×3): via INTRAVENOUS

## 2015-07-24 SURGICAL SUPPLY — 83 items
ADH SKN CLS APL DERMABOND .7 (GAUZE/BANDAGES/DRESSINGS) ×2
APPLIER CLIP ROT 10 11.4 M/L (STAPLE)
APR CLP MED LRG 11.4X10 (STAPLE)
BAG SPEC RTRVL LRG 6X4 10 (ENDOMECHANICALS)
CANISTER SUCTION 2500CC (MISCELLANEOUS) ×4 IMPLANT
CATH KIT ON Q 5IN SLV (PAIN MANAGEMENT) ×3 IMPLANT
CATH THORACIC 28FR (CATHETERS) IMPLANT
CATH THORACIC 28FR RT ANG (CATHETERS) IMPLANT
CATH THORACIC 36FR (CATHETERS) IMPLANT
CATH THORACIC 36FR RT ANG (CATHETERS) IMPLANT
CLIP APPLIE ROT 10 11.4 M/L (STAPLE) IMPLANT
CLIP TI MEDIUM 6 (CLIP) ×4 IMPLANT
CONN Y 3/8X3/8X3/8  BEN (MISCELLANEOUS)
CONN Y 3/8X3/8X3/8 BEN (MISCELLANEOUS) ×2 IMPLANT
CONNECTOR 1/2X3/8X1/2 3 WAY (MISCELLANEOUS) ×2
CONNECTOR 1/2X3/8X1/2 3WAY (MISCELLANEOUS) ×1 IMPLANT
CONT SPEC 4OZ CLIKSEAL STRL BL (MISCELLANEOUS) ×11 IMPLANT
COVER SURGICAL LIGHT HANDLE (MISCELLANEOUS) ×4 IMPLANT
DERMABOND ADVANCED (GAUZE/BANDAGES/DRESSINGS) ×2
DERMABOND ADVANCED .7 DNX12 (GAUZE/BANDAGES/DRESSINGS) IMPLANT
DRAIN CHANNEL 28F RND 3/8 FF (WOUND CARE) IMPLANT
DRAIN CHANNEL 32F RND 10.7 FF (WOUND CARE) IMPLANT
DRAPE LAPAROSCOPIC ABDOMINAL (DRAPES) ×4 IMPLANT
DRAPE WARM FLUID 44X44 (DRAPE) ×4 IMPLANT
ELECT REM PT RETURN 9FT ADLT (ELECTROSURGICAL) ×4
ELECTRODE REM PT RTRN 9FT ADLT (ELECTROSURGICAL) ×2 IMPLANT
GAUZE SPONGE 4X4 12PLY STRL (GAUZE/BANDAGES/DRESSINGS) IMPLANT
GLOVE BIO SURGEON STRL SZ 6.5 (GLOVE) ×2 IMPLANT
GLOVE BIO SURGEONS STRL SZ 6.5 (GLOVE) ×2
GLOVE BIOGEL PI IND STRL 6.5 (GLOVE) ×8 IMPLANT
GLOVE BIOGEL PI INDICATOR 6.5 (GLOVE) ×8
GLOVE ECLIPSE 6.5 STRL STRAW (GLOVE) ×6 IMPLANT
GLOVE SURG SIGNA 7.5 PF LTX (GLOVE) ×8 IMPLANT
GOWN STRL REUS W/ TWL LRG LVL3 (GOWN DISPOSABLE) ×6 IMPLANT
GOWN STRL REUS W/ TWL XL LVL3 (GOWN DISPOSABLE) ×2 IMPLANT
GOWN STRL REUS W/TWL LRG LVL3 (GOWN DISPOSABLE) ×12
GOWN STRL REUS W/TWL XL LVL3 (GOWN DISPOSABLE) ×4
HEMOSTAT SURGICEL 2X14 (HEMOSTASIS) IMPLANT
KIT BASIN OR (CUSTOM PROCEDURE TRAY) IMPLANT
KIT ROOM TURNOVER OR (KITS) ×4 IMPLANT
KIT SUCTION CATH 14FR (SUCTIONS) ×2 IMPLANT
NS IRRIG 1000ML POUR BTL (IV SOLUTION) ×8 IMPLANT
PACK CHEST (CUSTOM PROCEDURE TRAY) ×4 IMPLANT
PAD ARMBOARD 7.5X6 YLW CONV (MISCELLANEOUS) ×8 IMPLANT
POUCH ENDO CATCH II 15MM (MISCELLANEOUS) ×2 IMPLANT
POUCH SPECIMEN RETRIEVAL 10MM (ENDOMECHANICALS) IMPLANT
SEALANT PROGEL (MISCELLANEOUS) IMPLANT
SEALANT SURG COSEAL 4ML (VASCULAR PRODUCTS) IMPLANT
SEALANT SURG COSEAL 8ML (VASCULAR PRODUCTS) IMPLANT
SOLUTION ANTI FOG 6CC (MISCELLANEOUS) ×4 IMPLANT
SPECIMEN JAR MEDIUM (MISCELLANEOUS) ×4 IMPLANT
SPONGE GAUZE 4X4 12PLY STER LF (GAUZE/BANDAGES/DRESSINGS) ×3 IMPLANT
SPONGE INTESTINAL PEANUT (DISPOSABLE) ×9 IMPLANT
SPONGE TONSIL 1 RF SGL (DISPOSABLE) ×8 IMPLANT
SUT PROLENE 4 0 RB 1 (SUTURE)
SUT PROLENE 4-0 RB1 .5 CRCL 36 (SUTURE) IMPLANT
SUT SILK  1 MH (SUTURE) ×4
SUT SILK 1 MH (SUTURE) ×4 IMPLANT
SUT SILK 2 0SH CR/8 30 (SUTURE) ×2 IMPLANT
SUT SILK 3 0SH CR/8 30 (SUTURE) ×2 IMPLANT
SUT VIC AB 1 CTX 36 (SUTURE) ×4
SUT VIC AB 1 CTX36XBRD ANBCTR (SUTURE) IMPLANT
SUT VIC AB 2-0 CTX 36 (SUTURE) ×2 IMPLANT
SUT VIC AB 2-0 UR6 27 (SUTURE) IMPLANT
SUT VIC AB 3-0 MH 27 (SUTURE) IMPLANT
SUT VIC AB 3-0 X1 27 (SUTURE) ×4 IMPLANT
SUT VICRYL 2 TP 1 (SUTURE) IMPLANT
SWAB COLLECTION DEVICE MRSA (MISCELLANEOUS) IMPLANT
SYR CONTROL 10ML LL (SYRINGE) ×4 IMPLANT
SYSTEM SAHARA CHEST DRAIN ATS (WOUND CARE) ×4 IMPLANT
TAPE CLOTH 4X10 WHT NS (GAUZE/BANDAGES/DRESSINGS) ×4 IMPLANT
TAPE CLOTH SURG 4X10 WHT LF (GAUZE/BANDAGES/DRESSINGS) ×4 IMPLANT
TIP APPLICATOR SPRAY EXTEND 16 (VASCULAR PRODUCTS) IMPLANT
TOWEL OR 17X24 6PK STRL BLUE (TOWEL DISPOSABLE) ×1 IMPLANT
TOWEL OR 17X26 10 PK STRL BLUE (TOWEL DISPOSABLE) ×6 IMPLANT
TRAP SPECIMEN MUCOUS 40CC (MISCELLANEOUS) IMPLANT
TRAY FOLEY CATH 16FRSI W/METER (SET/KITS/TRAYS/PACK) ×4 IMPLANT
TROCAR XCEL BLADELESS 5X75MML (TROCAR) ×4 IMPLANT
TROCAR XCEL NON-BLD 5MMX100MML (ENDOMECHANICALS) IMPLANT
TUBE ANAEROBIC SPECIMEN COL (MISCELLANEOUS) IMPLANT
TUBE SUCT ARGYLE STRL (TUBING) ×4 IMPLANT
TUNNELER SHEATH ON-Q 11GX8 DSP (PAIN MANAGEMENT) ×2 IMPLANT
WATER STERILE IRR 1000ML POUR (IV SOLUTION) ×8 IMPLANT

## 2015-07-24 NOTE — Interval H&P Note (Signed)
History and Physical Interval Note:  07/24/2015 7:20 AM  Erica Browning  has presented today for surgery, with the diagnosis of MEDIASTINAL MASS  The various methods of treatment have been discussed with the patient and family. After consideration of risks, benefits and other options for treatment, the patient has consented to  Procedure(s): VIDEO ASSISTED THORACOSCOPY (Left) RESECTION OF MEDIASTINAL MASS (N/A) as a surgical intervention .  The patient's history has been reviewed, patient examined, no change in status, stable for surgery.  I have reviewed the patient's chart and labs.  Questions were answered to the patient's satisfaction.     [name]

## 2015-07-24 NOTE — H&P (View-Only) (Signed)
PCP is Marton Redwood, MD Referring Provider is Marton Redwood, MD  Chief Complaint  Patient presents with  . Lung Mass    Surgical eval on lung mass, CTA Chest 06/28/15 @ [facility]    HPI: 44 year old woman sent for consultation regarding an anterior mediastinal mass.   Mrs. Minardi is a 44 year old mother of 2 with no significant past medical history. She is very active and plays tennis on regular basis. About 2 weeks ago she had an episode where she noted that her right ring finger was blue in color and numb. This happened to her 2 nights in a row. She saw Dr. Brigitte Pulse who I did a workup on her including vascular studies which showed some compromise of flow. 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  In retrospect she says she has been having some chest discomfort. This is often positional, but never exertional. She describes it as a tight or stretching feeling in the chest. This is come and gone over the past couple of years. She is not having any exertional chest tightness, pressure or pain. She does not have any shortness of breath or wheezing. She has not had any weight loss.   Past Medical History  Diagnosis Date  . Mediastinal mass   . BPPV (benign paroxysmal positional vertigo)   . Fracture of wrist     as a child  . Arterial thrombosis     No past surgical history on file.  No family history on file. family history significant for cancer in mother and uncle.  Social History Social History  Substance Use Topics  . Smoking status: Never Smoker   . Smokeless tobacco: None  . Alcohol Use: 0.0 oz/week    0 Standard drinks or equivalent per week     Comment: social    Current Outpatient Prescriptions  Medication Sig Dispense Refill  . aspirin 81 MG tablet Take 81 mg by mouth daily.    . diclofenac (VOLTAREN) 75 MG EC tablet Take 1 tablet (75 mg total) by mouth 2 (two) times daily.  50 tablet 2  . ferrous sulfate 325 (65 FE) MG tablet Take 325 mg by mouth daily with breakfast.    . METRONIDAZOLE, TOPICAL, 0.75 % LOTN apply to [facility] once daily as directed  0  . Sulfacetamide Sodium-Sulfur 9.8-4.8 % LIQD   0   No current facility-administered medications for this visit.    No Known Allergies  Review of Systems  Constitutional: Negative for fever, chills, diaphoresis, activity change, appetite change, fatigue and unexpected weight change.  Eyes: Negative.   Respiratory: Negative for shortness of breath and wheezing.   Cardiovascular: Positive for chest pain (vague left anterior). Negative for leg swelling.  Gastrointestinal: Negative for abdominal pain and blood in stool.  Genitourinary: Positive for menstrual problem (heavy menses). Negative for hematuria and difficulty urinating.  Musculoskeletal: Negative for arthralgias, gait problem and neck pain.  Skin: Positive for color change (left ring finger "blue" resolved).  Neurological: Positive for numbness (ring finger right hand for 2 nights last week). Negative for dizziness and syncope.  Hematological: Negative for adenopathy. Does not bruise/bleed easily.    BP 145/90 mmHg  Pulse 74  Resp 20  Ht 5\' 7"  (1.702 m)  Wt 195 lb (88.451 kg)  BMI 30.53 kg/m2  SpO2 98%  LMP 06/07/2015 Physical Exam  Constitutional: She is oriented to person,  place, and time. She appears well-developed and well-nourished. No distress.  HENT:  Head: Normocephalic and atraumatic.  Eyes: Conjunctivae and EOM are normal. No scleral icterus.  Neck: Neck supple. No thyromegaly present.  Cardiovascular: Normal rate, regular rhythm and intact distal pulses.  Exam reveals no gallop and no friction rub.   No murmur heard. Pulmonary/Chest: Effort normal and breath sounds normal. No respiratory distress. She has no wheezes. She has no rales.  Abdominal: Soft. Bowel sounds are normal. She exhibits no distension. There is no tenderness.   Musculoskeletal: Normal range of motion. She exhibits no edema or tenderness.  Lymphadenopathy:    She has no cervical adenopathy.  Neurological: She is alert and oriented to person, place, and time. No cranial nerve deficit. She exhibits normal muscle tone. Coordination normal.  Skin: Skin is warm and dry. No rash noted. No erythema.  Vitals reviewed.    Diagnostic Tests:  Beta hCG < 1 Alpha-fetoprotein = 11.9 (0.0-8.3)  CT chest [facility] 06/28/2015 Impression No pulmonary embolus. Dominantly fatty lesion in the left anterior mediastinum was some wispy soft tissue component, they represented teratoma. Differential considerations include lipoma/liposarcoma and thymoma lipoma. Axial images of the right upper extremity were obtained. The study was not tightened appropriately to evaluate the arterial structures of the arm.  Echocardiogram Study conclusions Left ventricle: Cavity size was normal. Systolic function was normal. The estimated ejection fraction was in the range of 60-65%. Wall motion was normal; there were no regional wall motion abnormalities. Left trigger diastolic function parameters were normal. Impressions No cardiac source of emboli was identified.  Vascular study Decreased phasicity in the right fourth finger in comparison to the left suggestive of a possible ischemic finger.  I personally reviewed the CT chest images and lab results. I concur with the radiologist's findings regarding the intermediate style mass. Impression: Mrs. 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 Very slightly elevated alpha-fetoprotein levels are also consistent with teratoma.  Other possible things in the differential diagnosis include seminomatous or nonseminomatous germ cell tumors, lipoma, liposarcoma, thymoma. These are all much less likely than teratoma. I  discussed the differential diagnosis with Mrs. Swarm and we reviewed the films together.  I recommended that we resect this mass. This would best be done through a left VATS approach. The primary limiting factor is going to be getting the mass out of the chest, but otherwise we should be able to do the procedure through a relatively small incision. I reviewed the general nature of the procedure with Mrs. Henagar and her husband. We discussed the need for general anesthesia, the incisions to be used, the expected hospital stay, and the overall recovery. I reviewed the indications, risks, benefits, and alternatives. She understands the risks include, but are not limited to death (extremely unlikely), MI, DVT, PE, bleeding, possible need for transfusion, infection, phrenic nerve injury with diaphragmatic dysfunction, and chronic pain or paresthesias. They also understand there is a possibility of other unforeseeable complications. Overall she is a very low risk patient.  Plan: Left VATS, resection anterior mediastinal mass, On-Q local anesthetic catheter placement.  She will call to schedule.  I spent 45 minutes with Mrs. Tygart during this visit, greater than 50% was spent in counseling.  [name], MD [facility] [phone]

## 2015-07-24 NOTE — Brief Op Note (Addendum)
07/24/2015  12:43 PM  PATIENT:  Erica Browning  44 y.o. female  PRE-OPERATIVE DIAGNOSIS:  ANTERIOR MEDIASTINAL MASS  POST-OPERATIVE DIAGNOSIS:  TERATOMA  PROCEDURE:  Procedure(s):  VIDEO ASSISTED THORACOSCOPY  RESECTION OF ANTERIORMEDIASTINAL MASS INSERTION of ON-Q LOCAL ANESTHETIC CATHETER  SURGEON:  Surgeon(s) and Role:    * [name], MD - Primary  PHYSICIAN ASSISTANT: Erin Barrett PA-C  ANESTHESIA:   general  EBL:  Total I/O In: 1400 [I.V.:1400] Out: 800 [Urine:700; Blood:100]  BLOOD ADMINISTERED:none  DRAINS: 18 Blake drain x 2   LOCAL MEDICATIONS USED:  MARCAINE     SPECIMEN:  Source of Specimen:  Mediastinal Mass  DISPOSITION OF SPECIMEN:  PATHOLOGY  COUNTS:  YES  PLAN OF CARE: Admit to inpatient   PATIENT DISPOSITION:  PACU - hemodynamically stable.   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 Densely adherent to pericardium and surrounding tissue just cephalad to the PA, Margins negative.

## 2015-07-24 NOTE — Anesthesia Preprocedure Evaluation (Signed)
Anesthesia Evaluation  Patient identified by MRN, date of birth, ID band Patient awake    Reviewed: Allergy & Precautions, NPO status , Patient's Chart, lab work & pertinent test results  Airway Mallampati: II  TM Distance: >3 FB Neck ROM: Full  Mouth opening: Limited Mouth Opening  Dental  (+) Teeth Intact, Dental Advisory Given   Pulmonary    breath sounds clear to auscultation       Cardiovascular  Rhythm:Regular Rate:Normal     Neuro/Psych    GI/Hepatic   Endo/Other    Renal/GU      Musculoskeletal   Abdominal   Peds  Hematology   Anesthesia Other Findings   Reproductive/Obstetrics                             Anesthesia Physical Anesthesia Plan  ASA: II  Anesthesia Plan: General   Post-op Pain Management:    Induction: Intravenous  Airway Management Planned: Double Lumen EBT  Additional Equipment:   Intra-op Plan:   Post-operative Plan: Extubation in OR  Informed Consent: I have reviewed the patients History and Physical, chart, labs and discussed the procedure including the risks, benefits and alternatives for the proposed anesthesia with the patient or authorized representative who has indicated his/her understanding and acceptance.   Dental advisory given  Plan Discussed with: CRNA, Anesthesiologist and Surgeon  Anesthesia Plan Comments:         Anesthesia Quick Evaluation

## 2015-07-24 NOTE — Transfer of Care (Signed)
Immediate Anesthesia Transfer of Care Note  Patient: Erica Browning  Procedure(s) Performed: Procedure(s): VIDEO ASSISTED THORACOSCOPY (Left) RESECTION OF MEDIASTINAL MASS (N/A)  Patient Location: PACU  Anesthesia Type:General  Level of Consciousness: awake, alert , oriented and patient cooperative  Airway & Oxygen Therapy: Patient Spontanous Breathing and Patient connected to face mask oxygen  Post-op Assessment: Report given to RN, Post -op Vital signs reviewed and stable and Patient moving all extremities  Post vital signs: Reviewed and stable  Last Vitals:  Filed Vitals:   07/24/15 0549  BP: 117/102  Pulse: 92  Temp: 13.1 C    Complications: No apparent anesthesia complications

## 2015-07-24 NOTE — Anesthesia Procedure Notes (Addendum)
Procedure Name: Intubation Date/Time: 07/24/2015 7:45 AM Performed by: Julian Reil Pre-anesthesia Checklist: Patient identified, Emergency Drugs available, Suction available, Patient being monitored and Timeout performed Patient Re-evaluated:Patient Re-evaluated prior to inductionOxygen Delivery Method: Circle system utilized Preoxygenation: Pre-oxygenation with 100% oxygen Intubation Type: IV induction Ventilation: Mask ventilation without difficulty Laryngoscope Size: Mac and 4 Grade View: Grade I Tube type: Oral Endobronchial tube: Left and 37 Fr Number of attempts: 1 Airway Equipment and Method: Stylet Placement Confirmation: ETT inserted through vocal cords under direct vision,  positive ETCO2 and breath sounds checked- equal and bilateral Secured at: 31 cm Tube secured with: Tape Dental Injury: Teeth and Oropharynx as per pre-operative assessment  Comments: Performed by Berna Spare, SRNA      R IJV image for CVC placement.  Needle in place.

## 2015-07-24 NOTE — Progress Notes (Signed)
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 She appears to be doing well, in good spirits.  Appreciate Dr. Leonarda Salon surgical management of patient.  Recommend resuming anticoagulation with Heparin/Lovenox bridge and Coumadin when stable from surgical standpoint due to recent digital thrombosis and positive Lupus Anticoagulant on thrombophilia evaluation.  Has SCDs in place for DVT prophylaxis.  We will see her back in the office after discharge to monitor INR.

## 2015-07-25 ENCOUNTER — Inpatient Hospital Stay (HOSPITAL_COMMUNITY): Payer: 59

## 2015-07-25 ENCOUNTER — Encounter (HOSPITAL_COMMUNITY): Payer: Self-pay | Admitting: Thoracic Surgery (Cardiothoracic Vascular Surgery)

## 2015-07-25 LAB — BASIC METABOLIC PANEL
ANION GAP: 6 (ref 5–15)
CHLORIDE: 105 mmol/L (ref 101–111)
CO2: 25 mmol/L (ref 22–32)
Calcium: 8.2 mg/dL — ABNORMAL LOW (ref 8.9–10.3)
Creatinine, Ser: 0.55 mg/dL (ref 0.44–1.00)
GFR calc Af Amer: >60 mL/min (ref >60)
Glucose, Bld: 124 mg/dL — ABNORMAL HIGH (ref 65–99)
POTASSIUM: 3.4 mmol/L — AB (ref 3.5–5.1)
SODIUM: 136 mmol/L (ref 135–145)

## 2015-07-25 LAB — BLOOD GAS, ARTERIAL
Acid-base deficit: 1.2 mmol/L (ref 0.0–2.0)
BICARBONATE: 23 meq/L (ref 20.0–24.0)
Drawn by: 418751
FIO2: 0.21
O2 Saturation: 93.8 %
PCO2 ART: 38.4 mmHg (ref 35.0–45.0)
PH ART: 7.395 (ref 7.350–7.450)
Patient temperature: 98.6
TCO2: 24.2 mmol/L (ref 0–100)
pO2, Arterial: 71.5 mmHg — ABNORMAL LOW (ref 80.0–100.0)

## 2015-07-25 LAB — CBC
HEMATOCRIT: 34.6 % — AB (ref 36.0–46.0)
HEMOGLOBIN: 11.3 g/dL — AB (ref 12.0–15.0)
MCH: 29.5 pg (ref 26.0–34.0)
MCHC: 32.7 g/dL (ref 30.0–36.0)
MCV: 90.3 fL (ref 78.0–100.0)
Platelets: 204 10*3/uL (ref 150–400)
RBC: 3.83 MIL/uL — AB (ref 3.87–5.11)
RDW: 13.5 % (ref 11.5–15.5)
WBC: 6.4 10*3/uL (ref 4.0–10.5)

## 2015-07-25 MED ORDER — WARFARIN - PHYSICIAN DOSING INPATIENT
Freq: Every day | Status: DC
  Administered 2015-07-27: 18:00:00

## 2015-07-25 MED ORDER — ENOXAPARIN SODIUM 40 MG/0.4ML ~~LOC~~ SOLN
40.0000 mg | SUBCUTANEOUS | Status: DC
  Administered 2015-07-25 – 2015-07-28 (×4): 40 mg via SUBCUTANEOUS
  Filled 2015-07-25 (×4): qty 0.4

## 2015-07-25 MED ORDER — LEVALBUTEROL HCL 0.63 MG/3ML IN NEBU: 0.6300 mg | INHALATION_SOLUTION | Freq: Three times a day (TID) | RESPIRATORY_TRACT | Status: DC | PRN

## 2015-07-25 MED ORDER — WARFARIN SODIUM 5 MG PO TABS
5.0000 mg | ORAL_TABLET | Freq: Every day | ORAL | Status: DC
  Administered 2015-07-25 – 2015-07-27 (×3): 5 mg via ORAL
  Filled 2015-07-25 (×4): qty 1

## 2015-07-25 NOTE — Progress Notes (Addendum)
       [address]       [place] [address]             [phone]          1 Day Post-Op Procedure(s) (LRB): VIDEO ASSISTED THORACOSCOPY (Left) RESECTION OF MEDIASTINAL MASS (N/A)  Subjective: OOB to chair. Sore but overall feels well. No nausea, breathing stable.   Objective: Vital signs in last 24 hours: Patient Vitals for the past 24 hrs:  BP Temp Temp src Pulse Resp SpO2  07/25/15 0313 - - - - (!) 28 98 %  07/25/15 0300 107/71 mmHg 98.3 F (36.8 C) Oral 92 12 95 %  07/24/15 2300 102/76 mmHg - - 98 15 97 %  07/24/15 2255 - - - - 15 96 %  07/24/15 2240 - 98.6 F (37 C) Oral - - -  07/24/15 2102 - - - - 16 96 %  07/24/15 2005 - - - - - 96 %  07/24/15 2000 127/88 mmHg - - (!) 120 (!) 26 97 %  07/24/15 1939 - 98 F (36.7 C) - - - -  07/24/15 1800 (!) 141/94 mmHg - - (!) 109 (!) 22 98 %  07/24/15 1700 124/87 mmHg - - (!) 112 15 95 %  07/24/15 1600 127/85 mmHg - - (!) 111 16 93 %  07/24/15 1520 (!) 131/91 mmHg 98.6 F (37 C) Oral - - 92 %  07/24/15 1445 - - - (!) 111 17 100 %  07/24/15 1430 - 98.2 F (36.8 C) - (!) 112 18 100 %  07/24/15 1415 125/84 mmHg - - 99 20 100 %  07/24/15 1400 126/78 mmHg - - 93 (!) 23 100 %  07/24/15 1345 129/83 mmHg - - 92 (!) 29 100 %  07/24/15 1330 119/86 mmHg - - 90 (!) 24 99 %  07/24/15 1315 115/75 mmHg 97 F (36.1 C) - 79 (!) 28 100 %   Current Weight  07/20/15 195 lb 12.8 oz (88.814 kg)     Intake/Output from previous day: 10/10 0701 - 10/11 0700 In: 3150 [P.O.:600; I.V.:2500; IV Piggyback:50] Out: 3810 [Urine:3400; Drains:310; Blood:100]    PHYSICAL EXAM:  Heart: RRR Lungs: Clear Wound: Clean and dry Chest tube: No air leak    Lab Results: CBC: Recent Labs  07/25/15 0418  WBC 6.4  HGB 11.3*  HCT 34.6*  PLT 204   BMET:  Recent Labs  07/25/15 0418  NA 136  K 3.4*  CL 105  CO2 25  GLUCOSE 124*  BUN <5*  CREATININE 0.55  CALCIUM 8.2*    PT/INR: No results for input(s): LABPROT,  INR in the last 72 hours.    Assessment/Plan: S/P Procedure(s) (LRB): VIDEO ASSISTED THORACOSCOPY (Left) RESECTION OF MEDIASTINAL MASS (N/A) CXR not done yet this am, so will follow up. CT output around 300 ml/ past 24 hrs, no air leak. Continue CT for now, hopefully can decrease to water seal if CXR ok. Hypokalemia- replace K+ per protocol. Mobilize, work on [employer], routine POD #1 progression.   LOS: 1 day    COLLINS,GINA H 07/25/2015  Patient seen and examined, agree with above. Looks good CXR OK, slight elevation of left hemidiaphragm CT to water seal Dc Foley and a line Advance diet Ambulate Will start prophylactic dose of lovenox today and start coumadin  Remo Lipps C. Roxan Hockey, MD [facility] [phone]

## 2015-07-25 NOTE — Op Note (Signed)
NAMEBANEZA, BARTOSZEK            ACCOUNT NO.:  [id number]  MEDICAL RECORD NO.:  [id number]  LOCATION:  3S03C                        FACILITY:  [facility]  PHYSICIAN:  Revonda Standard. Roxan Hockey, M.D.DATE OF BIRTH:  [birth year]  DATE OF PROCEDURE:  07/24/2015 DATE OF DISCHARGE:                              OPERATIVE REPORT   PREOPERATIVE DIAGNOSIS:  Anterior mediastinal mass.  POSTOPERATIVE DIAGNOSIS:  Teratoma.  PROCEDURE:   Left video-assisted thoracoscopy Resection of anterior mediastinal mass,  On-Q local anesthetic catheter placement.  SURGEON:  Revonda Standard. Roxan Hockey, M.D.  ASSISTANT:  Ellwood Handler, PA-C.  ANESTHESIA:  General.  FINDINGS:  8 cm mass consistent with teratoma with necrotic fluid and hair, densely adherent to the pericardium and surrounding tissues superiorly just cephalad to the pulmonary artery. Unable to dissect phrenic nerve off of the mass in this area.  Margins negative for tumor.  CLINICAL NOTE:  Mrs. Lewan is a 44 year old woman who presented initially with a blue right ring finger.  Her workup included vascular studies and chest x-ray.  CT findings showed 6 x 7 cm anterior mediastinal mass on the left side, suggestive of teratoma.  She was advised to undergo surgical resection.  The indications, risks, benefits, and alternatives were discussed in detail with the patient. She understood and accepted the risks and agreed to proceed.  DESCRIPTION OF PROCEDURE:  Mrs. Meenan was brought to the preoperative holding area on July 24, 2015.  Anesthesia placed a central line and an arterial blood pressure monitoring line.  Intravenous antibiotics were administered.  She was taken to the operating room, anesthetized, and intubated with a double-lumen endotracheal tube.  A Foley catheter was placed.  Sequential compressive devices were placed on the calves for DVT prophylaxis.  She was placed in a right lateral decubitus position and the left chest was prepped  and draped in usual sterile fashion.  Active warming was in place.  The chest was prepped and draped in usual sterile fashion.  An incision was made in the seventh interspace in the midaxillary line.  A 5 mm port was inserted and the thoracoscope was advanced into the chest.  There was good isolation of the left lung.  There was large mediastinal mass. There were adhesions of the upper lobe to the mass.  There was no pleural effusion.  A 5 cm utility incision was made in the third interspace anterolaterally.  No rib spreading was performed during the procedure.  Initially, some of the adhesions between the lung and the mass were taken down.  The phrenic nerve was identified inferior to the mass and preserved.  The pleural lining overlying the mass was incised with electrocautery and the dissection was carried down to the mass which was well encapsulated inferiorly.  A combination of sharp and blunt dissection was used and, initially, the mass shelled out very easily over approximately 80% of the surface area.  As the dissection was carried more superiorly, the dissection became more difficult, particularly posteriorly near the pulmonary hilum.  The phrenic nerve became indistinguishable from the mass.  The pericardium was entered and there was a calcified nodule within the pericardium adjacent to the mass, this was dissected out.  An attempt was  initially made to take this intrapericardial component en bloc, but the calcified component of the mass within the pericardium became dislodged.  It was sent together with a main specimen.  The dissection was continued both anteriorly and posteriorly.  A plane could not be developed and there was concern that the could be malignant. There definitely were severe dense adhesions in this area.  Ultimately, resection was performed, taking a margin of obviously uninvolved clean tissue to ensure a complete resection. The phrenic nerve could not be dissected  off the mass in this area and was divided. During the dissection, the capsule of the mass ruptured.  There was necrotic thick yellow fluid within the mass.  It was evacuated.  There was hair within the mass as well consistent with a teratoma.  After getting around the mass at the area where it was adherent to the pericardium and surrounding mediastinal tissue, the resection was completed.  The mass was placed into an endoscopic retrieval bag and removed through the incision.  It was sent for frozen section of the superior margin as that was the area of questionable invasion and that margin returned negative.  The chest was copiously irrigated with warm saline.  A test inflation showed no air leak from the lung.  A final inspection was made for hemostasis.  An On- Q local anesthetic catheter was placed through a stab incision posteriorly and tunneled into a subpleural location.  28-French Blake drains were placed through the original port incision and a separate stab incision and more directed anteriorly and posteriorly.  They were secured with #1 silk sutures.  The lung was reinflated.  The utility incision was closed with a running #1 Vicryl fascial suture followed by 2-0 Vicryl subcutaneous suture and 3-0 Vicryl subcuticular suture.  All sponge, needle, and instrument counts were correct at the end of the procedure.  The patient was taken from the operating room to the postanesthetic care unit, extubated and in good condition.     Revonda Standard Roxan Hockey, M.D.     SCH/MEDQ  D:  07/24/2015  T:  07/25/2015  Job:  [id number]

## 2015-07-25 NOTE — Anesthesia Postprocedure Evaluation (Signed)
  Anesthesia Post-op Note  Patient: Erica Browning  Procedure(s) Performed: Procedure(s): VIDEO ASSISTED THORACOSCOPY (Left) RESECTION OF MEDIASTINAL MASS (N/A)  Patient Location: PACU  Anesthesia Type:General  Level of Consciousness: awake  Airway and Oxygen Therapy: Patient Spontanous Breathing  Post-op Pain: mild  Post-op Assessment: Post-op Vital signs reviewed, Patient's Cardiovascular Status Stable, Respiratory Function Stable, Patent Airway, No signs of Nausea or vomiting and Pain level controlled              Post-op Vital Signs: Reviewed and stable  Last Vitals:  Filed Vitals:   07/25/15 1428  BP:   Pulse:   Temp: 36.7 C  Resp:     Complications: No apparent anesthesia complications

## 2015-07-26 ENCOUNTER — Inpatient Hospital Stay (HOSPITAL_COMMUNITY): Payer: 59

## 2015-07-26 LAB — COMPREHENSIVE METABOLIC PANEL
ALBUMIN: 2.3 g/dL — AB (ref 3.5–5.0)
ALK PHOS: 51 U/L (ref 38–126)
ALT: 81 U/L — AB (ref 14–54)
AST: 43 U/L — AB (ref 15–41)
Anion gap: 4 — ABNORMAL LOW (ref 5–15)
CALCIUM: 7.1 mg/dL — AB (ref 8.9–10.3)
CO2: 23 mmol/L (ref 22–32)
CREATININE: 0.5 mg/dL (ref 0.44–1.00)
Chloride: 111 mmol/L (ref 101–111)
GFR calc Af Amer: >60 mL/min (ref >60)
GFR calc non Af Amer: >60 mL/min (ref >60)
GLUCOSE: 92 mg/dL (ref 65–99)
Potassium: 3.4 mmol/L — ABNORMAL LOW (ref 3.5–5.1)
SODIUM: 138 mmol/L (ref 135–145)
Total Bilirubin: 0.6 mg/dL (ref 0.3–1.2)
Total Protein: 4.9 g/dL — ABNORMAL LOW (ref 6.5–8.1)

## 2015-07-26 LAB — CBC
HCT: 32.1 % — ABNORMAL LOW (ref 36.0–46.0)
HEMOGLOBIN: 10.4 g/dL — AB (ref 12.0–15.0)
MCH: 29.6 pg (ref 26.0–34.0)
MCHC: 32.4 g/dL (ref 30.0–36.0)
MCV: 91.5 fL (ref 78.0–100.0)
PLATELETS: 210 10*3/uL (ref 150–400)
RBC: 3.51 MIL/uL — AB (ref 3.87–5.11)
RDW: 13.5 % (ref 11.5–15.5)
WBC: 5.4 10*3/uL (ref 4.0–10.5)

## 2015-07-26 LAB — PROTIME-INR
INR: 1.23 (ref 0.00–1.49)
Prothrombin Time: 15.7 seconds — ABNORMAL HIGH (ref 11.6–15.2)

## 2015-07-26 MED ORDER — MAGNESIUM HYDROXIDE 400 MG/5ML PO SUSP: 15.0000 mL | Freq: Every day | ORAL | Status: DC | PRN

## 2015-07-26 MED ORDER — WARFARIN VIDEO
Freq: Once | Status: AC
  Administered 2015-07-26: 12:00:00

## 2015-07-26 MED ORDER — COUMADIN BOOK
Freq: Once | Status: AC
  Administered 2015-07-26: 11:00:00
  Filled 2015-07-26: qty 1

## 2015-07-26 MED ORDER — DOCUSATE SODIUM 100 MG PO CAPS
100.0000 mg | ORAL_CAPSULE | Freq: Two times a day (BID) | ORAL | Status: DC
  Administered 2015-07-26 – 2015-07-28 (×4): 100 mg via ORAL
  Filled 2015-07-26 (×4): qty 1

## 2015-07-26 NOTE — Progress Notes (Signed)
       [address]       [place] [address]             [phone]          2 Days Post-Op Procedure(s) (LRB): VIDEO ASSISTED THORACOSCOPY (Left) RESECTION OF MEDIASTINAL MASS (N/A)  Subjective: Feels well, no complaints except constipation. Passing flatus. No nausea. Breathing stable.   Objective: Vital signs in last 24 hours: Patient Vitals for the past 24 hrs:  BP Temp Temp src Pulse Resp SpO2 Height Weight  07/26/15 0515 (!) 129/96 mmHg 98.2 F (36.8 C) Oral 100 (!) 21 99 % - -  07/25/15 2326 - 98.3 F (36.8 C) Oral - - - - -  07/25/15 2325 (!) 145/91 mmHg - - (!) 108 (!) 22 97 % - -  07/25/15 2000 - - - (!) 104 (!) 28 96 % - -  07/25/15 1936 134/88 mmHg 98.3 F (36.8 C) Oral (!) 109 (!) 23 96 % - -  07/25/15 1428 - 98.1 F (36.7 C) Oral - - - - -  07/25/15 1425 120/83 mmHg - - - - - - -  07/25/15 1136 114/81 mmHg 97.7 F (36.5 C) Oral (!) 104 (!) 23 100 % - -  07/25/15 0823 - 98.1 F (36.7 C) Oral - - - - -  07/25/15 0818 107/76 mmHg - - 88 - - - -  07/25/15 0800 - - - - - - 5\' 7"  (1.702 m) 195 lb (88.451 kg)   Current Weight  07/25/15 195 lb (88.451 kg)     Intake/Output from previous day: 10/11 0701 - 10/12 0700 In: 1330 [P.O.:480; I.V.:700; IV Piggyback:150] Out: 300 [Drains:300]    PHYSICAL EXAM:  Heart: RRR Lungs: Clear Wound: Clean and dry Chest tube: No air leak    Lab Results: CBC: Recent Labs  07/25/15 0418 07/26/15 0525  WBC 6.4 5.4  HGB 11.3* 10.4*  HCT 34.6* 32.1*  PLT 204 210   BMET:  Recent Labs  07/25/15 0418 07/26/15 0525  NA 136 138  K 3.4* 3.4*  CL 105 111  CO2 25 23  GLUCOSE 124* 92  BUN <5* <5*  CREATININE 0.55 0.50  CALCIUM 8.2* 7.1*    PT/INR:  Recent Labs  07/26/15 0525  LABPROT 15.7*  INR 1.23   CXR: stable, no ptx, elevation of R hemidiaphragm unchanged   Assessment/Plan: S/P Procedure(s) (LRB): VIDEO ASSISTED THORACOSCOPY (Left) RESECTION OF MEDIASTINAL MASS (N/A) CXR  stable, CTs with no air leak. Output decreasing, 200->100 ml over past 2 shifts. Hopefully can d/c one CT today. Continue to water seal. Hypokalemia- replace K+. GI- LOC today. Continue Lovenox, Coumadin loading. Continue ambulation, pulm toilet, IV to KVO.   LOS: 2 days    Shlome Baldree H 07/26/2015

## 2015-07-26 NOTE — Progress Notes (Signed)
2 Days Post-Op Procedure(s) (LRB): VIDEO ASSISTED THORACOSCOPY (Left) RESECTION OF MEDIASTINAL MASS (N/A) Subjective: No complaints  Objective: Vital signs in last 24 hours: Temp:  [97.7 F (36.5 C)-98.3 F (36.8 C)] 97.7 F (36.5 C) (10/12 0805) Pulse Rate:  [100-109] 100 (10/12 0515) Cardiac Rhythm:  [-] Normal sinus rhythm (10/12 0515) Resp:  [21-28] 21 (10/12 0515) BP: (114-145)/(81-96) 129/96 mmHg (10/12 0515) SpO2:  [96 %-100 %] 99 % (10/12 0515)  Hemodynamic parameters for last 24 hours:    Intake/Output from previous day: 10/11 0701 - 10/12 0700 In: 1330 [P.O.:480; I.V.:700; IV Piggyback:150] Out: 300 [Drains:300] Intake/Output this shift:    General appearance: alert, cooperative and no distress Neurologic: intact Heart: regular rate and rhythm Lungs: diminished breath sounds left base Wound: clean and dry serous drainage form CT  Lab Results:  Recent Labs  07/25/15 0418 07/26/15 0525  WBC 6.4 5.4  HGB 11.3* 10.4*  HCT 34.6* 32.1*  PLT 204 210   BMET:  Recent Labs  07/25/15 0418 07/26/15 0525  NA 136 138  K 3.4* 3.4*  CL 105 111  CO2 25 23  GLUCOSE 124* 92  BUN <5* <5*  CREATININE 0.55 0.50  CALCIUM 8.2* 7.1*    PT/INR:  Recent Labs  07/26/15 0525  LABPROT 15.7*  INR 1.23   ABG    Component Value Date/Time   PHART 7.395 07/25/2015 0415   HCO3 23.0 07/25/2015 0415   TCO2 24.2 07/25/2015 0415   ACIDBASEDEF 1.2 07/25/2015 0415   O2SAT 93.8 07/25/2015 0415   CBG (last 3)  No results for input(s): GLUCAP in the last 72 hours.  Assessment/Plan: S/P Procedure(s) (LRB): VIDEO ASSISTED THORACOSCOPY (Left) RESECTION OF MEDIASTINAL MASS (N/A) POD # 2   Doing well Has some mild elevation of left hemidiaphragm PATH- benign teratoma- patient informed Dc anterior CT SCD + enoxaparin for DVT prophylaxis PCA for pain control Anemia secondary to ABL- mild, follow   LOS: 2 days    [name] 07/26/2015

## 2015-07-27 ENCOUNTER — Inpatient Hospital Stay (HOSPITAL_COMMUNITY): Payer: 59

## 2015-07-27 LAB — PROTIME-INR
INR: 1.16 (ref 0.00–1.49)
PROTHROMBIN TIME: 14.9 s (ref 11.6–15.2)

## 2015-07-27 MED ORDER — POTASSIUM CHLORIDE CRYS ER 20 MEQ PO TBCR
20.0000 meq | EXTENDED_RELEASE_TABLET | Freq: Two times a day (BID) | ORAL | Status: DC
  Administered 2015-07-27 – 2015-07-28 (×3): 20 meq via ORAL
  Filled 2015-07-27 (×4): qty 1

## 2015-07-27 NOTE — Progress Notes (Signed)
Results from chest xry given patient has a Left Apical Pnuemothorax measured at 5% or less.

## 2015-07-27 NOTE — Discharge Summary (Signed)
[address]       [place] [address]             [phone]              Discharge Summary  Name: Erica Browning DOB: 1971-09-02 44 y.o. MRN: [id number]   Admission Date: 07/24/2015 Discharge Date: 07/28/2015    Admitting Diagnosis: Anterior mediastinal mass   Discharge Diagnosis:  Teratoma, anterior mediastinum  Past Medical History  Diagnosis Date  . Mediastinal mass   . BPPV (benign paroxysmal positional vertigo)   . Fracture of wrist     as a child  . Arterial thrombosis ([place])   . Family history of adverse reaction to anesthesia     Patients mother had headaches and vomiting  . Rosacea   . Plantar fasciitis of left foot      Procedures: LEFT VIDEO ASSISTED THORACOSCOPY - 07/24/2015 RESECTION OF ANTERIOR MEDIASTINAL MASS     HPI:  The patient is a 44 y.o. female with no significant past medical history. She is very active and plays tennis on regular basis. About 2 weeks ago, she had an episode where she noted that her right ring finger was blue in color and numb. This happened to her 2 nights in a row. She saw Dr. Brigitte Pulse who did a workup on her including vascular studies which showed some compromise of flow. During this workup, she was found to have a 6 x 7 cm anterior mediastinal mass on the left side. 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 Very slightly elevated alpha-fetoprotein levels are also consistent with teratoma.  In retrospect, she says she has been having some chest discomfort. This is often positional, but never exertional. She describes it as a tight or stretching feeling in the chest. This has come and gone over the past couple of years. She is not having any exertional chest tightness, pressure or pain. She does not have any shortness of breath or wheezing. She has not had any weight loss.  The patient was referred to Dr. Roxan Hockey for consideration of surgical resection. The differential diagnoses were discussed  with the patient and it was felt that the mass should be resected at this time and could best be done through a VATS approach. All risks, benefits and alternatives of surgery were explained in detail, and the patient agreed to proceed.    Hospital Course:  The patient was admitted to [facility] on 07/24/2015. The patient was taken to the operating room and underwent the above procedure.    The postoperative course has been uneventful. Chest tubes have been removed in the standard fashion and chest x-rays have been stable. Incisions are healing well.  Coumadin has been started with a Lovenox bridge for her digital thrombosis, and INR is slowly trending up. She is ambulating in the hall without difficulty and is tolerating a regular diet. 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  The patient is progressing well and is medically stable on today's date for discharge home.     Recent vital signs:  Filed Vitals:   07/28/15 1200  BP:   Pulse:   Temp: 98.5 F (36.9 C)  Resp:     Recent laboratory studies:  CBC:  Recent Labs  07/26/15 0525  WBC 5.4  HGB 10.4*  HCT 32.1*  PLT 210   BMET:   Recent Labs  07/26/15 0525 07/28/15 0315  NA 138 134*  K 3.4* 4.1  CL  111 99*  CO2 23 26  GLUCOSE 92 101*  BUN <5* 6  CREATININE 0.50 0.57  CALCIUM 7.1* 8.7*    PT/INR:   Recent Labs  07/28/15 0315  LABPROT 14.6  INR 1.12     Discharge Medications:     Medication List    STOP taking these medications        diclofenac 75 MG EC tablet  Commonly known as:  VOLTAREN      TAKE these medications        enoxaparin 40 MG/0.4ML injection  Commonly known as:  LOVENOX  Inject 0.4 mLs (40 mg total) into the skin daily.     ferrous sulfate 325 (65 FE) MG tablet  Take 325 mg by mouth daily with breakfast.     ICY HOT ARTHRITIS PAIN RELIEF EX  Apply 1 application topically daily as needed (back pain).     METRONIDAZOLE (TOPICAL) 0.75 % Lotn  apply to [facility] once daily  as directed     oxyCODONE 5 MG immediate release tablet  Commonly known as:  Oxy IR/ROXICODONE  Take 1-2 tablets (5-10 mg total) by mouth every 4 (four) hours as needed for severe pain.     Sulfacetamide Sodium-Sulfur 9.8-4.8 % Liqd  Apply 1 application topically at bedtime.     warfarin 7.5 MG tablet  Commonly known as:  COUMADIN  Take 1 tablet (7.5 mg total) by mouth daily at 6 PM. Or as directed by MD.         Discharge Instructions:  The patient is to refrain from driving, heavy lifting or strenuous activity.  May shower daily and clean incisions with soap and water.  May resume regular diet.   Follow Up: Follow-up Information    Follow up with [name], MD On 08/15/2015.   Specialty:  Cardiothoracic Surgery   Why:  Have a chest x-ray at [facility] at 12:45, then see MD at 1:45   Contact information:   [place] [address] [phone]       Follow up with [facility] On 08/04/2015.   Why:  For suture removal with the nurse at 10:30   Contact information:   [address] [place] [phone]       Follow up with Marton Redwood, MD.   Specialty:  Internal Medicine   Why:  Please follow up next week to check bloodwork for Coumadin (PT/INR)   Contact information:   [place] [address] [address] 07/28/2015, 1:11 PM

## 2015-07-27 NOTE — Progress Notes (Signed)
Patients right peripheral IV infiltrated contacted PA Gold to request patient no receive a new IV due to no needed fluids or IV medications.  PA Gold informed and agreed with continuing with no IV access at this time.  Will continue to monitor.

## 2015-07-27 NOTE — Discharge Instructions (Signed)

## 2015-07-27 NOTE — Progress Notes (Addendum)
       [address]       [place] [address]             [phone]          3 Days Post-Op Procedure(s) (LRB): VIDEO ASSISTED THORACOSCOPY (Left) RESECTION OF MEDIASTINAL MASS (N/A)  Subjective: Comfortable, no complaints. Had a BM yesterday. Walking in halls independently. Mild discomfort at CT site.   Objective: Vital signs in last 24 hours: Patient Vitals for the past 24 hrs:  BP Temp Temp src Pulse Resp SpO2  07/27/15 0350 - - - - (!) 22 99 %  07/27/15 0341 - 98.1 F (36.7 C) Oral - - -  07/27/15 0335 (!) 137/93 mmHg 98.1 F (36.7 C) Oral 88 18 98 %  07/26/15 2349 - - - - 20 -  07/26/15 2305 - 98.5 F (36.9 C) Oral - - -  07/26/15 2300 (!) 138/92 mmHg 98.5 F (36.9 C) Oral 88 (!) 21 91 %  07/26/15 2018 - - - - (!) 22 100 %  07/26/15 2000 133/90 mmHg 98.6 F (37 C) Oral 99 20 98 %  07/26/15 1959 - 98.6 F (37 C) Oral - - -  07/26/15 1851 - 98.1 F (36.7 C) Axillary - - -  07/26/15 1531 - - - - 20 98 %  07/26/15 1434 (!) 119/57 mmHg 97.6 F (36.4 C) Oral (!) 110 (!) 22 100 %  07/26/15 1200 - - - 86 (!) 22 100 %  07/26/15 1136 (!) 146/87 mmHg 98.1 F (36.7 C) Axillary 90 14 100 %  07/26/15 0805 129/85 mmHg 97.7 F (36.5 C) Oral 94 (!) 21 99 %  07/26/15 0800 - - - - (!) 22 99 %   Current Weight  07/25/15 195 lb (88.451 kg)     Intake/Output from previous day: 10/12 0701 - 10/13 0700 In: 648 [P.O.:360; I.V.:288] Out: 305 [Drains:305]    PHYSICAL EXAM:  Heart: RRR Lungs: Few coarse BS that clear with cough on left Wound: Clean and dry Chest tube: No air leak    Lab Results: CBC: Recent Labs  07/25/15 0418 07/26/15 0525  WBC 6.4 5.4  HGB 11.3* 10.4*  HCT 34.6* 32.1*  PLT 204 210   BMET:  Recent Labs  07/25/15 0418 07/26/15 0525  NA 136 138  K 3.4* 3.4*  CL 105 111  CO2 25 23  GLUCOSE 124* 92  BUN <5* <5*  CREATININE 0.55 0.50  CALCIUM 8.2* 7.1*    PT/INR:  Recent Labs  07/27/15 0345  LABPROT 14.9  INR  1.16    CXR: stable, no ptx   Assessment/Plan: S/P Procedure(s) (LRB): VIDEO ASSISTED THORACOSCOPY (Left) RESECTION OF MEDIASTINAL MASS (N/A) Around 300 ml serosanguinous drainage from CT yesterday, no air leak.  CXR stable. Hopefully can d/c remaining CT today. Once CT is out, will saline lock IVF, d/c PCA, d/c central line. Will d/c On-Q. Continue ambulation, pulm toilet. Possibly home 1-2 days if she remains stable.   LOS: 3 days    COLLINS,GINA H 07/27/2015  Patient seen and examined, agree with above She is not using PCA much and thinks she can manage with PO pain meds. Dc PCA, dc central line CXR shows elevation of left hemidiaphragm as expected  Remo Lipps C. Roxan Hockey, MD [facility] [phone]

## 2015-07-27 NOTE — Progress Notes (Signed)
Fentanyl PCA 74ml syringe wasted per protocol with Fausto Skillern as witness.

## 2015-07-28 ENCOUNTER — Inpatient Hospital Stay (HOSPITAL_COMMUNITY): Payer: 59

## 2015-07-28 LAB — BASIC METABOLIC PANEL
Anion gap: 9 (ref 5–15)
BUN: 6 mg/dL (ref 6–20)
CALCIUM: 8.7 mg/dL — AB (ref 8.9–10.3)
CO2: 26 mmol/L (ref 22–32)
CREATININE: 0.57 mg/dL (ref 0.44–1.00)
Chloride: 99 mmol/L — ABNORMAL LOW (ref 101–111)
GLUCOSE: 101 mg/dL — AB (ref 65–99)
Potassium: 4.1 mmol/L (ref 3.5–5.1)
Sodium: 134 mmol/L — ABNORMAL LOW (ref 135–145)

## 2015-07-28 LAB — PROTIME-INR
INR: 1.12 (ref 0.00–1.49)
PROTHROMBIN TIME: 14.6 s (ref 11.6–15.2)

## 2015-07-28 MED ORDER — WARFARIN SODIUM 7.5 MG PO TABS: 7.5000 mg | ORAL_TABLET | Freq: Every day | ORAL | Status: DC

## 2015-07-28 MED ORDER — ENOXAPARIN SODIUM 40 MG/0.4ML ~~LOC~~ SOLN: 40.0000 mg | SUBCUTANEOUS | Status: DC

## 2015-07-28 MED ORDER — WARFARIN SODIUM 7.5 MG PO TABS
7.5000 mg | ORAL_TABLET | Freq: Every day | ORAL | Status: DC
  Filled 2015-07-28: qty 1

## 2015-07-28 MED ORDER — OXYCODONE HCL 5 MG PO TABS: 5.0000 mg | ORAL_TABLET | ORAL | Status: DC | PRN

## 2015-07-28 NOTE — Progress Notes (Signed)
Erica Browning to be D/C'd Home per MD order.  Discussed with the patient and all questions fully answered.  VSS, Skin clean, dry and intact without evidence of skin break down, no evidence of skin tears noted. IV catheter discontinued intact. Site without signs and symptoms of complications. Dressing and pressure applied.  An After Visit Summary was printed and given to the patient. Patient received prescription.  D/c education completed with patient/family including follow up instructions, medication list, d/c activities limitations if indicated, with other d/c instructions as indicated by MD - patient able to verbalize understanding, all questions fully answered.   Patient instructed to return to ED, call 911, or call MD for any changes in condition.   Patient escorted via [place], and D/C home via private auto.  [name] [facility] 07/28/2015 2:21 PM

## 2015-07-28 NOTE — Progress Notes (Addendum)
       [address]       [place] [address]             [phone]          4 Days Post-Op Procedure(s) (LRB): VIDEO ASSISTED THORACOSCOPY (Left) RESECTION OF MEDIASTINAL MASS (N/A)  Subjective: Feels well, no complaints.   Objective: Vital signs in last 24 hours: Patient Vitals for the past 24 hrs:  BP Temp Temp src Pulse Resp SpO2  07/28/15 0734 129/81 mmHg 98 F (36.7 C) Oral 88 16 99 %  07/28/15 0317 119/76 mmHg 98 F (36.7 C) Oral 79 16 95 %  07/27/15 2335 (!) 136/92 mmHg 97.9 F (36.6 C) Oral 73 19 99 %  07/27/15 1950 136/90 mmHg 98.1 F (36.7 C) Oral (!) 113 17 100 %  07/27/15 1534 - 98.4 F (36.9 C) Oral - - -  07/27/15 1533 137/84 mmHg - - (!) 106 (!) 23 100 %  07/27/15 1239 (!) 126/91 mmHg 98.1 F (36.7 C) Oral 86 20 100 %  07/27/15 0851 (!) 133/92 mmHg 98.1 F (36.7 C) Oral 92 (!) 30 99 %   Current Weight  07/25/15 195 lb (88.451 kg)     Intake/Output from previous day: 10/13 0701 - 10/14 0700 In: 120 [P.O.:120] Out: 150 [Drains:150]    PHYSICAL EXAM:  Heart: RRR Lungs: Clear, slightly diminished BS in L base Wound: Clean and dry Chest tube: No air leak    Lab Results: CBC: Recent Labs  07/26/15 0525  WBC 5.4  HGB 10.4*  HCT 32.1*  PLT 210   BMET:  Recent Labs  07/26/15 0525 07/28/15 0315  NA 138 134*  K 3.4* 4.1  CL 111 99*  CO2 23 26  GLUCOSE 92 101*  BUN <5* 6  CREATININE 0.50 0.57  CALCIUM 7.1* 8.7*    PT/INR:  Recent Labs  07/28/15 0315  LABPROT 14.6  INR 1.12   CXR stable small apical ptx   Assessment/Plan: S/P Procedure(s) (LRB): VIDEO ASSISTED THORACOSCOPY (Left) RESECTION OF MEDIASTINAL MASS (N/A) CT output decreasing, 180->70 ml out over past 2 shifts. CXR stable. Hopefully CT can be d/c'ed today. Continue Lovenox/Coumadin for digital thrombosis.    LOS: 4 days    COLLINS,GINA H 07/28/2015  Patient seen and examined, agree with above Dc chest tube Home later today INR  has not bumped, increase Coumadin to 7.5 mg daily, recheck INR on Monday  Steven C. Roxan Hockey, MD [facility] [phone]

## 2015-08-04 ENCOUNTER — Ambulatory Visit (INDEPENDENT_AMBULATORY_CARE_PROVIDER_SITE_OTHER): Payer: Self-pay | Admitting: *Deleted

## 2015-08-04 DIAGNOSIS — J9859 Other diseases of mediastinum, not elsewhere classified: Secondary | ICD-10-CM

## 2015-08-04 DIAGNOSIS — D4989 Neoplasm of unspecified behavior of other specified sites: Secondary | ICD-10-CM

## 2015-08-04 DIAGNOSIS — Z09 Encounter for follow-up examination after completed treatment for conditions other than malignant neoplasm: Secondary | ICD-10-CM

## 2015-08-04 DIAGNOSIS — Z4802 Encounter for removal of sutures: Secondary | ICD-10-CM

## 2015-08-04 NOTE — Progress Notes (Signed)
Ms. Norkus returns for suture removal of her previous chest tube sites.  These were easily removed. They, like the small thoracotomy incision, are healing well. I did place a steri-strip on one of the chest tube incisions due to it opening after suture removal.  She relates minimal discomfort.  She will return as scheduled with a cxr.

## 2015-08-14 ENCOUNTER — Other Ambulatory Visit: Payer: Self-pay | Admitting: Thoracic Surgery (Cardiothoracic Vascular Surgery)

## 2015-08-14 DIAGNOSIS — J9859 Other diseases of mediastinum, not elsewhere classified: Secondary | ICD-10-CM

## 2015-08-15 ENCOUNTER — Encounter: Payer: Self-pay | Admitting: Thoracic Surgery (Cardiothoracic Vascular Surgery)

## 2015-08-15 ENCOUNTER — Ambulatory Visit (INDEPENDENT_AMBULATORY_CARE_PROVIDER_SITE_OTHER): Payer: Self-pay | Admitting: Thoracic Surgery (Cardiothoracic Vascular Surgery)

## 2015-08-15 ENCOUNTER — Ambulatory Visit
Admission: RE | Admit: 2015-08-15 | Discharge: 2015-08-15 | Disposition: A | Discharge status: Home / Self Care | Payer: 59 | Source: Ambulatory Visit | Attending: Thoracic Surgery (Cardiothoracic Vascular Surgery) | Admitting: Thoracic Surgery (Cardiothoracic Vascular Surgery)

## 2015-08-15 VITALS — BP 118/76 | HR 96 | Resp 16 | Ht 67.0 in | Wt 188.0 lb

## 2015-08-15 DIAGNOSIS — D383 Neoplasm of uncertain behavior of mediastinum: Secondary | ICD-10-CM

## 2015-08-15 DIAGNOSIS — Z09 Encounter for follow-up examination after completed treatment for conditions other than malignant neoplasm: Secondary | ICD-10-CM

## 2015-08-15 DIAGNOSIS — J9859 Other diseases of mediastinum, not elsewhere classified: Secondary | ICD-10-CM

## 2015-08-15 DIAGNOSIS — D4989 Neoplasm of unspecified behavior of other specified sites: Secondary | ICD-10-CM

## 2015-08-15 NOTE — Progress Notes (Signed)
      [address]       [place] [address]             [phone]       HPI: Mrs. Boggio returns today for a scheduled postoperative follow-up visit.  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 Her postoperative course was uncomplicated and she was discharged on 07/28/2015.  She has been feeling well. She does get short of breath with activity. She also gets tired more easily than she did previously. She has some incisional discomfort, which she describes as more of a tenderness than pain. She is not taking any narcotics.  Past Medical History  Diagnosis Date  . Mediastinal mass   . BPPV (benign paroxysmal positional vertigo)   . Fracture of wrist     as a child  . Arterial thrombosis ([place])   . Family history of adverse reaction to anesthesia     Patients mother had headaches and vomiting  . Rosacea   . Plantar fasciitis of left foot      Current Outpatient Prescriptions  Medication Sig Dispense Refill  . enoxaparin (LOVENOX) 40 MG/0.4ML injection Inject 0.4 mLs (40 mg total) into the skin daily. (Patient taking differently: Inject 80 mg into the skin daily. ) 0.8 mL 1  . ferrous sulfate 325 (65 FE) MG tablet Take 325 mg by mouth daily with breakfast.    . METRONIDAZOLE, TOPICAL, 0.75 % LOTN apply to [facility] once daily as directed  0  . Sulfacetamide Sodium-Sulfur 9.8-4.8 % LIQD Apply 1 application topically at bedtime.   0  . warfarin (COUMADIN) 7.5 MG tablet Take 1 tablet (7.5 mg total) by mouth daily at 6 PM. Or as directed by MD. 60 tablet 1   No current facility-administered medications for this visit.    Physical Exam BP 118/76 mmHg  Pulse 96  Resp 16  Ht 5\' 7"  (1.702 m)  Wt 188 lb (85.276 kg)  BMI 29.44 kg/m2  SpO2 96%  LMP 07/08/2015 44 year old woman in no acute distress Well-developed well-nourished Alert and oriented 3 with no focal deficits Cardiac regular rate and  rhythm normal S1 and S2 Lungs with diminished breath sounds at left base Incisions healing well  Diagnostic Tests: I personally reviewed her chest x-ray. It shows postoperative changes with an elevated left hemidiaphragm.  Impression: Mrs. Cloninger is a 44 year old woman who had a teratoma resected thoracoscopically 3 weeks ago. The tumor turned out to be a benign. There was an area of intense inflammation superiorly which intraoperatively appeared to possibly be malignant. This involved the phrenic nerve which had to be transected. Due to that she has an elevated left hemidiaphragm. She is not symptomatic from that at rest. He remains to be seen how well she will tolerate activities. If it is very limiting to her we can consider plication of the diaphragm at some point in the future. I would wait at least 6 months to a year before considering that procedure.  She otherwise is doing quite well. At this point there are no restrictions on her activities, but she was cautioned to build into new activities gradually. She has been driving without any difficulty.  Plan: Return in 6 months with PA and lateral chest x-ray  [name], MD [facility] [phone]

## 2015-10-15 DIAGNOSIS — S27809A Unspecified injury of diaphragm, initial encounter: Secondary | ICD-10-CM

## 2015-10-15 HISTORY — DX: Unspecified injury of diaphragm, initial encounter: S27.809A

## 2016-02-12 ENCOUNTER — Other Ambulatory Visit: Payer: Self-pay | Admitting: Thoracic Surgery (Cardiothoracic Vascular Surgery)

## 2016-02-12 DIAGNOSIS — J9859 Other diseases of mediastinum, not elsewhere classified: Secondary | ICD-10-CM

## 2016-02-13 ENCOUNTER — Encounter: Payer: Self-pay | Admitting: Thoracic Surgery (Cardiothoracic Vascular Surgery)

## 2016-02-13 ENCOUNTER — Ambulatory Visit
Admission: RE | Admit: 2016-02-13 | Discharge: 2016-02-13 | Disposition: A | Discharge status: Home / Self Care | Payer: 59 | Source: Ambulatory Visit | Attending: Thoracic Surgery (Cardiothoracic Vascular Surgery) | Admitting: Thoracic Surgery (Cardiothoracic Vascular Surgery)

## 2016-02-13 ENCOUNTER — Ambulatory Visit (INDEPENDENT_AMBULATORY_CARE_PROVIDER_SITE_OTHER): Payer: 59 | Admitting: Thoracic Surgery (Cardiothoracic Vascular Surgery)

## 2016-02-13 VITALS — BP 126/87 | HR 81 | Resp 16 | Ht 67.0 in | Wt 193.0 lb

## 2016-02-13 DIAGNOSIS — D383 Neoplasm of uncertain behavior of mediastinum: Secondary | ICD-10-CM | POA: Diagnosis not present

## 2016-02-13 DIAGNOSIS — D4989 Neoplasm of unspecified behavior of other specified sites: Secondary | ICD-10-CM

## 2016-02-13 DIAGNOSIS — J9859 Other diseases of mediastinum, not elsewhere classified: Secondary | ICD-10-CM

## 2016-02-13 DIAGNOSIS — Z09 Encounter for follow-up examination after completed treatment for conditions other than malignant neoplasm: Secondary | ICD-10-CM | POA: Diagnosis not present

## 2016-02-13 NOTE — Progress Notes (Signed)
[address]       [place] [address]             [phone]       HPI: Erica Browning returns today for a scheduled 6 month follow-up visit.  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 Her postoperative course was uncomplicated and she was discharged on 07/28/2015. The mass was densely adherent to the phrenic nerve and the nerve had to be sacrificed to achieve a complete resection. She did have elevation of her left hemidiaphragm on her postoperative follow-up visit.  Overall she feels better than she did prior to surgery. She is now off Coumadin and has not had any thrombosis issues. She still gets short of breath with activity. 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 She says she feels short of breath when she first lies flat on her back and often coughs when she first lies down as well. She also feels short of breath when she bends over to tie her shoes. She has not really tried to go back to the gym and train.  Past Medical History  Diagnosis Date  . Mediastinal mass   . BPPV (benign paroxysmal positional vertigo)   . Fracture of wrist     as a child  . Arterial thrombosis ([place])   . Family history of adverse reaction to anesthesia     Patients mother had headaches and vomiting  . Rosacea   . Plantar fasciitis of left foot       Current Outpatient Prescriptions  Medication Sig Dispense Refill  . ferrous sulfate 325 (65 FE) MG tablet Take 325 mg by mouth daily with breakfast.    . METRONIDAZOLE, TOPICAL, 0.75 % LOTN apply to [facility] once daily as directed  0   No current facility-administered medications for this visit.    Physical Exam BP 126/87 mmHg  Pulse 81  Resp 16  Ht 5\' 7"  (1.702 m)  Wt 193 lb (87.544 kg)  BMI 30.22 kg/m2  SpO83 89% 45 year old woman in no acute distress Alert and oriented 3 with no focal deficits Cardiac  regular rate and rhythm normal S1 and S2 Diminished breath sounds left base, otherwise clear Incisions well healed  Diagnostic Tests: CHEST 2 VIEW  COMPARISON: 08/15/2015 .  FINDINGS: Mediastinum and hilar structures normal. Left base subsegmental atelectasis and/or scarring. Stable elevation left hemidiaphragm. No definite pneumothorax noted on today's exam. Heart size normal. No acute bony abnormality .  IMPRESSION: Left base subsegmental atelectasis and/or scarring. Stable elevation left hemidiaphragm. No acute or focal abnormality otherwise noted.   Electronically Signed  By: Marcello Moores Register  On: 02/13/2016 09:49 I personally reviewed her chest x-ray her with the findings as noted above  Impression: 45 year old woman who is now 6 months out from resection of a large mediastinal teratoma. She has no evidence of recurrence of the tumor. I will plan to get a CT scan in 6 months  Arterial thrombosis- she is now off of anticoagulation with no new issues.  Elevated left hemidiaphragm secondary to left phrenic nerve division of time surgery. There is still a possibility that she could regenerate the nerve although I think it is unlikely. I discussed the possibility of referral for a phrenic nerve grafting procedure. This is not widely accepted and she does not really have any interest in pursuing that currently. Another surgical option  would be plication of left hemidiaphragm which might improve her exercise tolerance. The final option is no intervention. She does not have to make any final decisions at this point. We had a long discussion regarding this issue. Currently she is not satisfied with the quality of life. She is planning to start working out in playing tennis again. If it becomes a limiting issue for her with that we can consider surgery at a later time.  Plan: Return in 6 months with CT with IV contrast to rule out recurrence.  [name], MD [facility] [phone]

## 2016-08-03 IMAGING — CR DG CHEST 1V PORT
1 series · 1 of 1 positions shown · non-contrast
Comparison: 07/24/2015

CLINICAL DATA: Postoperative evaluation after teratoma resection
from the mediastinum

EXAM:
PORTABLE CHEST 1 VIEW

[AP]
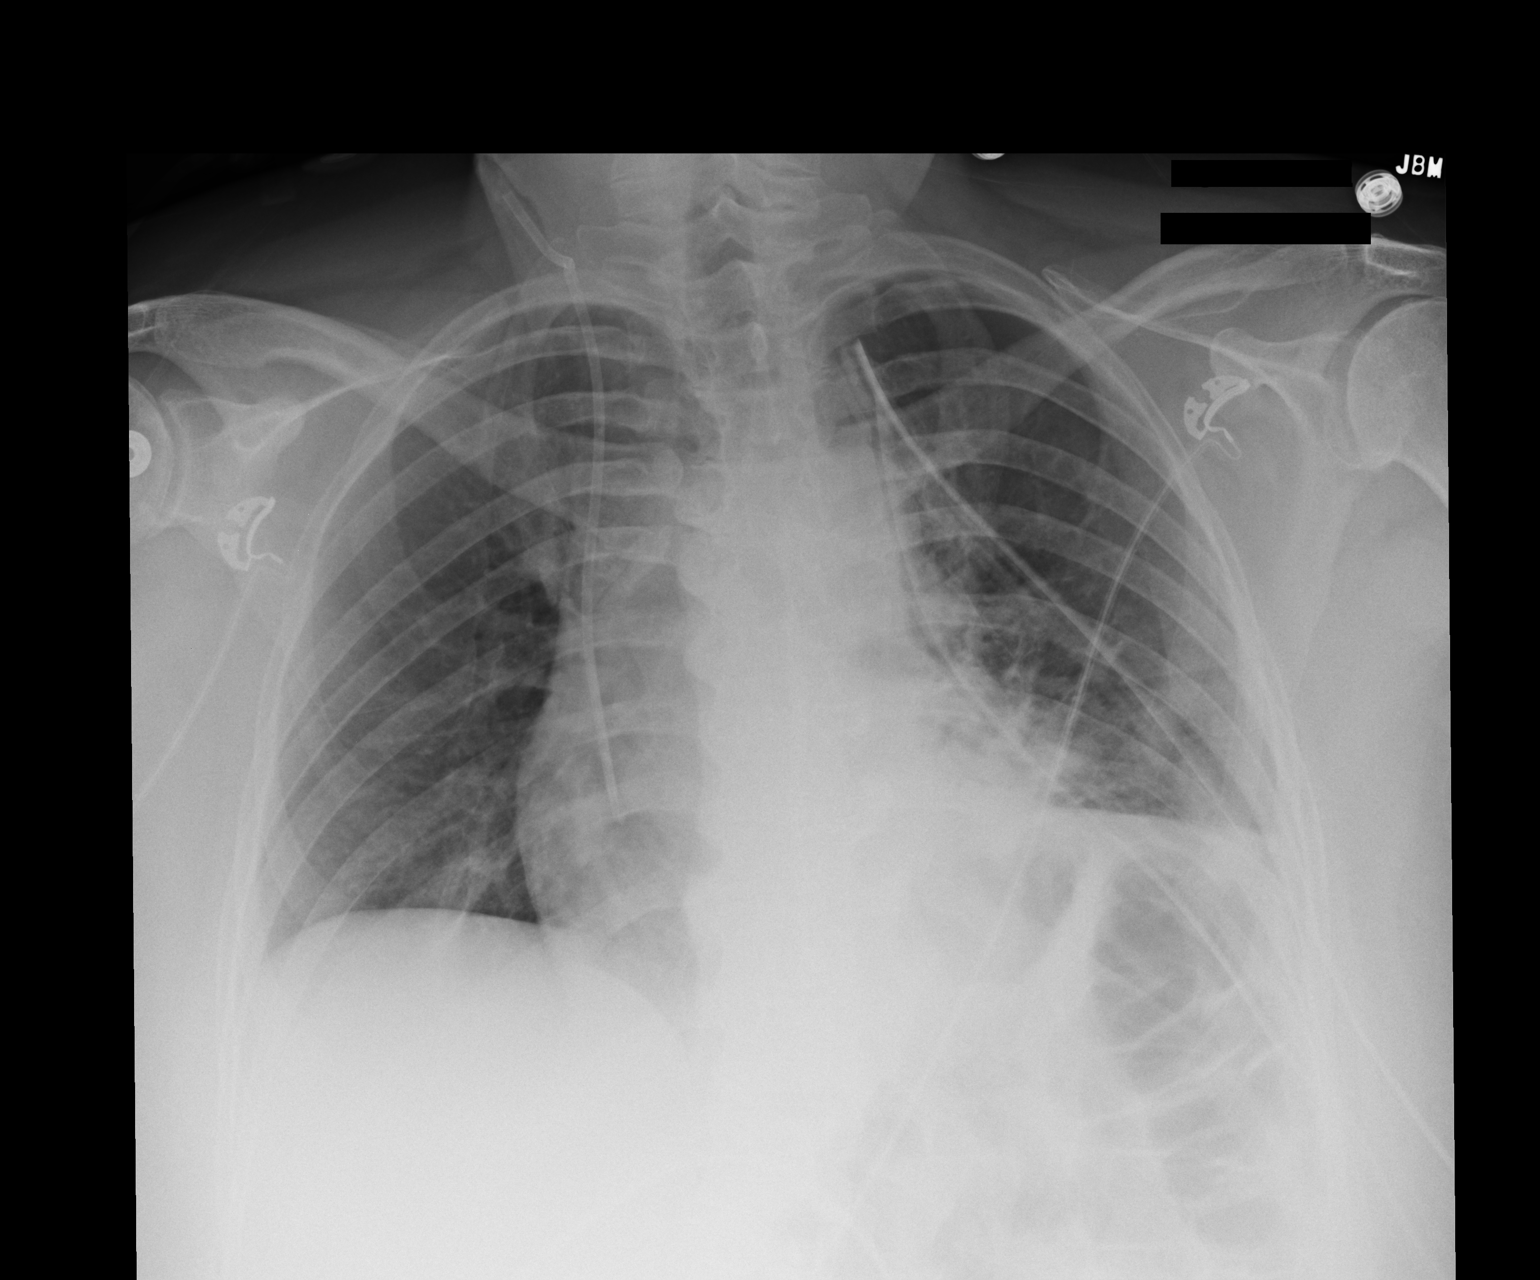

[1 of 1 positions shown; findings below may reference images not displayed]

FINDINGS: Right central line and left chest tube stable. Right lung is clear.
Consolidation medial left lower lobe mildly increased when compared
to prior study. No definite pneumothorax.
IMPRESSION: Improved aeration on the right with minimal atelectasis at the base.

Increased opacification left lung base consistent with mildly
increased atelectasis on the left.

## 2016-08-05 ENCOUNTER — Other Ambulatory Visit: Payer: Self-pay | Admitting: *Deleted

## 2016-08-05 DIAGNOSIS — J9859 Other diseases of mediastinum, not elsewhere classified: Secondary | ICD-10-CM

## 2016-08-22 ENCOUNTER — Other Ambulatory Visit: Payer: Self-pay | Admitting: Internal Medicine

## 2016-08-22 DIAGNOSIS — R1011 Right upper quadrant pain: Secondary | ICD-10-CM

## 2016-08-28 ENCOUNTER — Ambulatory Visit
Admission: RE | Admit: 2016-08-28 | Discharge: 2016-08-28 | Disposition: A | Discharge status: Home / Self Care | Payer: 59 | Source: Ambulatory Visit | Attending: Internal Medicine | Admitting: Internal Medicine

## 2016-08-28 DIAGNOSIS — R1011 Right upper quadrant pain: Secondary | ICD-10-CM

## 2016-09-03 ENCOUNTER — Ambulatory Visit: Payer: 59 | Admitting: Thoracic Surgery (Cardiothoracic Vascular Surgery)

## 2016-09-03 ENCOUNTER — Other Ambulatory Visit: Payer: 59

## 2016-09-11 ENCOUNTER — Ambulatory Visit (INDEPENDENT_AMBULATORY_CARE_PROVIDER_SITE_OTHER): Payer: 59 | Admitting: Orthopaedic Surgery

## 2016-09-17 ENCOUNTER — Encounter: Payer: Self-pay | Admitting: Thoracic Surgery (Cardiothoracic Vascular Surgery)

## 2016-09-17 ENCOUNTER — Ambulatory Visit
Admission: RE | Admit: 2016-09-17 | Discharge: 2016-09-17 | Disposition: A | Discharge status: Home / Self Care | Payer: 59 | Source: Ambulatory Visit | Attending: Thoracic Surgery (Cardiothoracic Vascular Surgery) | Admitting: Thoracic Surgery (Cardiothoracic Vascular Surgery)

## 2016-09-17 ENCOUNTER — Ambulatory Visit (INDEPENDENT_AMBULATORY_CARE_PROVIDER_SITE_OTHER): Payer: 59 | Admitting: Thoracic Surgery (Cardiothoracic Vascular Surgery)

## 2016-09-17 VITALS — BP 138/90 | HR 68 | Resp 16 | Ht 67.0 in | Wt 180.0 lb

## 2016-09-17 DIAGNOSIS — Z09 Encounter for follow-up examination after completed treatment for conditions other than malignant neoplasm: Secondary | ICD-10-CM

## 2016-09-17 DIAGNOSIS — D4989 Neoplasm of unspecified behavior of other specified sites: Secondary | ICD-10-CM | POA: Diagnosis not present

## 2016-09-17 DIAGNOSIS — J986 Disorders of diaphragm: Secondary | ICD-10-CM | POA: Diagnosis not present

## 2016-09-17 DIAGNOSIS — D152 Benign neoplasm of mediastinum: Secondary | ICD-10-CM

## 2016-09-17 DIAGNOSIS — J9859 Other diseases of mediastinum, not elsewhere classified: Secondary | ICD-10-CM

## 2016-09-17 MED ORDER — IOPAMIDOL (ISOVUE-300) INJECTION 61%
75.0000 mL | Freq: Once | INTRAVENOUS | Status: AC | PRN
  Administered 2016-09-17: 75 mL via INTRAVENOUS

## 2016-09-17 NOTE — Progress Notes (Signed)
[address]       [place] [address]             [phone]    HPI: Erica Browning returns for a scheduled 1 year follow-up visit  She is a 45 year old woman who had a thoracoscopic resection of an anterior mediastinal teratoma on 07/24/2015. She had an uncommon complicated postoperative course with the exception of an elevated left hemidiaphragm.  She has been doing well. She is not short of breath with routine activities. She does get some shortness of breath with exertion and her stamina is not where it was prior to the surgery. She is not having any chest pain. Past Medical History:  Diagnosis Date  . Arterial thrombosis ([place])   . BPPV (benign paroxysmal positional vertigo)   . Family history of adverse reaction to anesthesia    Patients mother had headaches and vomiting  . Fracture of wrist    as a child  . Mediastinal mass   . Plantar fasciitis of left foot   . Rosacea      Current Outpatient Prescriptions  Medication Sig Dispense Refill  . ferrous sulfate 325 (65 FE) MG tablet Take 325 mg by mouth daily with breakfast.    . METRONIDAZOLE, TOPICAL, 0.75 % LOTN apply to [facility] once daily as directed  0   No current facility-administered medications for this visit.     Physical Exam BP 138/90 (BP Location: Right Arm, Patient Position: Sitting, Cuff Size: Large)   Pulse 68   Resp 16   Ht 5\' 7"  (1.702 m)   Wt 180 lb (81.6 kg)   LMP 09/16/2016   SpO2 99% Comment: RA  BMI 28.11 kg/m  46 year old woman in no acute distress Alert and oriented 3 with no focal deficits No cervical or supraclavicular adenopathy Cardiac regular rate and rhythm normal S1 and S2 Lungs absent breath sounds left base, otherwise clear  Diagnostic Tests: CT CHEST WITH CONTRAST  TECHNIQUE: Multidetector CT imaging of the chest was performed during intravenous contrast administration.  CONTRAST:  51mL ISOVUE-300 IOPAMIDOL (ISOVUE-300) INJECTION 61%  COMPARISON:   None.  FINDINGS: Cardiovascular: Heart is normal in size.  No pericardial effusion.  No evidence of thoracic aortic aneurysm.  Mediastinum/Nodes: No suspicious mediastinal lymphadenopathy.  Trace fluid/postsurgical change in the prevascular region (series 3/ image 44).  Visualized thyroid is unremarkable.  Lungs/Pleura: Eventration of the left hemidiaphragm.  Associated scarring/ atelectasis in the left upper lobe and superior segment left lower lobe.  No focal consolidation.  No suspicious pulmonary nodules.  No pleural effusion or pneumothorax.  Upper Abdomen: Visualized upper abdomen is unremarkable.  Musculoskeletal: Degenerative changes of the mid thoracic spine.  IMPRESSION: Postsurgical changes in the prevascular region.  No evidence of recurrent or metastatic disease in the chest.  Eventration of the left hemidiaphragm. Associated scarring/ atelectasis in the left upper and lower lobes.   Electronically Signed   By: Julian Hy M.D.   On: 09/17/2016 11:04 I personally reviewed the CT images and concur with the findings noted above.  Impression: Erica Browning is a 45 year old woman who is now a year out from resection of a mediastinal teratoma. This was benign. It did surround the phrenic nerve. There is no evidence of recurrent disease, but there is some scarring and soft tissue in the anterior mediastinum.  Overall she is doing very well. She is not unhappy with her quality of life. She does get short of breath more easily  than she used to, but is not bothering her to the extent that she wants to have anything done about that presently.  I did review the films with Mr. and Erica Browning. I discussed with them potential treatments including plication of the diaphragm and phrenic nerve grafting (which would have to be done elsewhere). She is not interested in either of those options presently.  Plan: Return in one year with CT chest to  reevaluate surgical site.  [name], MD [facility] [phone]

## 2016-09-18 ENCOUNTER — Ambulatory Visit (INDEPENDENT_AMBULATORY_CARE_PROVIDER_SITE_OTHER): Payer: 59 | Admitting: Orthopaedic Surgery

## 2016-09-18 DIAGNOSIS — G8929 Other chronic pain: Secondary | ICD-10-CM | POA: Diagnosis not present

## 2016-09-18 DIAGNOSIS — M25562 Pain in left knee: Secondary | ICD-10-CM

## 2016-09-18 DIAGNOSIS — M25561 Pain in right knee: Secondary | ICD-10-CM

## 2016-09-18 NOTE — Progress Notes (Signed)
Office Visit Note   Patient: Erica Browning           Date of Birth: 06-15-1971           MRN: [id number] Visit Date: 09/18/2016              Requested by: Marton Redwood, MD [address] [place], [place] [address] PCP: Marton Redwood, MD   Assessment & Plan: Visit Diagnoses:  1. Chronic pain of left knee   2. Chronic pain of right knee     Plan: I showed her quad strengthening exercises on her try to help strengthen these itself. I'm also going to see her the first week of January prior to her ski trip second place a steroid injection in each knee. We did show her right knee x-rays from a year ago that showed moderate arthritis especially most significant at the patellofemoral joint. She is deathly candidate for hyaluronic acid injections I gave her handout about this as well.  Follow-Up Instructions: Return in about 3 weeks (around 10/09/2016).   Orders:  No orders of the defined types were placed in this encounter.  No orders of the defined types were placed in this encounter.     Procedures: No procedures performed   Clinical Data: No additional findings.   Subjective: Chief Complaint  Patient presents with  . Left Knee - Pain    Patient states she is going snow skiing in [place] in January. She really wants to ski, so she wants some suggestions on what she could do.  . Right Knee - Pain   Her biggest complaint of her knees is a popping sounds in the grinding underneath her kneecaps. She does get swelling on the right knee the left knee. She denies any locking and catching. This is been slowly getting worse with time. We saw her last year with x-rays of her knees did show mild to moderate arthritis of both knees. She is now not only been on any type of blood thinning medications from her mediastinal mass that was removed last year. HPI  Review of Systems He currently denies any chest pain, shortness of breath, headache, fever, chills, nausea,  vomiting.  Objective: Vital Signs: LMP 09/16/2016   Physical Exam She is alert and oriented 3 and in no acute distress. Ortho Exam Lamination of both knee shows significant patellofemoral crepitation. There is a slight mild effusion on the right knee comparing the right left knees. Her Lachman's and McMurray's exams are negative and she has excellent range of motion of both knees. There is deathly significant popping of both knees as well. Specialty Comments:  No specialty comments available.  Imaging: Ct Chest W Contrast  Result Date: 09/17/2016 CLINICAL DATA:  Shortness of breath, status post mediastinal teratoma resection EXAM: CT CHEST WITH CONTRAST TECHNIQUE: Multidetector CT imaging of the chest was performed during intravenous contrast administration. CONTRAST:  65mL ISOVUE-300 IOPAMIDOL (ISOVUE-300) INJECTION 61% COMPARISON:  None. FINDINGS: Cardiovascular: Heart is normal in size.  No pericardial effusion. No evidence of thoracic aortic aneurysm. Mediastinum/Nodes: No suspicious mediastinal lymphadenopathy. Trace fluid/postsurgical change in the prevascular region (series 3/ image 44). Visualized thyroid is unremarkable. Lungs/Pleura: Eventration of the left hemidiaphragm. Associated scarring/ atelectasis in the left upper lobe and superior segment left lower lobe. No focal consolidation. No suspicious pulmonary nodules. No pleural effusion or pneumothorax. Upper Abdomen: Visualized upper abdomen is unremarkable. Musculoskeletal: Degenerative changes of the mid thoracic spine. IMPRESSION: Postsurgical changes in the prevascular region. No evidence of  recurrent or metastatic disease in the chest. Eventration of the left hemidiaphragm. Associated scarring/ atelectasis in the left upper and lower lobes. Electronically Signed   By: Julian Hy M.D.   On: 09/17/2016 11:04     PMFS History: Patient Active Problem List   Diagnosis Date Noted  . Acquired elevated hemidiaphragm  09/17/2016  . Mediastinal mass 07/24/2015  . Fatty lesion left anterior mediastinum, CTA Chest 06/28/2015 07/05/2015   Past Medical History:  Diagnosis Date  . Arterial thrombosis ([place])   . BPPV (benign paroxysmal positional vertigo)   . Family history of adverse reaction to anesthesia    Patients mother had headaches and vomiting  . Fracture of wrist    as a child  . Mediastinal mass   . Plantar fasciitis of left foot   . Rosacea     Family History  Problem Relation Age of Onset  . Cancer Mother   . Cancer Paternal Uncle     Past Surgical History:  Procedure Laterality Date  . RESECTION OF MEDIASTINAL MASS N/A 07/24/2015   Procedure: RESECTION OF MEDIASTINAL MASS;  Surgeon: [name], MD;  Location: [place];  Service: Thoracic;  Laterality: N/A;  . VIDEO ASSISTED THORACOSCOPY Left 07/24/2015   Procedure: VIDEO ASSISTED THORACOSCOPY;  Surgeon: [name], MD;  Location: [facility];  Service: Thoracic;  Laterality: Left;  . WISDOM TOOTH EXTRACTION     Social History   Occupational History  . homemaker    Social History Main Topics  . Smoking status: Never Smoker  . Smokeless tobacco: Not on file  . Alcohol use 0.0 oz/week     Comment: social  . Drug use: No  . Sexual activity: Not on file

## 2016-10-16 ENCOUNTER — Ambulatory Visit (INDEPENDENT_AMBULATORY_CARE_PROVIDER_SITE_OTHER): Payer: 59 | Admitting: Orthopaedic Surgery

## 2016-10-16 DIAGNOSIS — M25562 Pain in left knee: Secondary | ICD-10-CM

## 2016-10-16 DIAGNOSIS — G8929 Other chronic pain: Secondary | ICD-10-CM | POA: Insufficient documentation

## 2016-10-16 DIAGNOSIS — M25561 Pain in right knee: Secondary | ICD-10-CM | POA: Diagnosis not present

## 2016-10-16 MED ORDER — METHYLPREDNISOLONE ACETATE 40 MG/ML IJ SUSP
40.0000 mg | INTRAMUSCULAR | Status: AC | PRN
  Administered 2016-10-16: 40 mg via INTRA_ARTICULAR

## 2016-10-16 MED ORDER — LIDOCAINE HCL 1 % IJ SOLN
3.0000 mL | INTRAMUSCULAR | Status: AC | PRN
  Administered 2016-10-16: 3 mL

## 2016-10-16 NOTE — Progress Notes (Signed)
Office Visit Note   Patient: Erica Browning           Date of Birth: Apr 12, 1971           MRN: [id number] Visit Date: 10/16/2016              Requested by: Marton Redwood, MD [address] [place], [place] [address] PCP: Marton Redwood, MD   Assessment & Plan: Visit Diagnoses:  1. Bilateral chronic knee pain     Plan: She tolerated both steroid injections well and her knees. She understand our next up would be a hyaluronic acid if needed. She'll follow-up as needed.  Follow-Up Instructions: Return if symptoms worsen or fail to improve.   Orders:  No orders of the defined types were placed in this encounter.  No orders of the defined types were placed in this encounter.     Procedures: Large Joint Inj Date/Time: 10/16/2016 10:04 AM Performed by: Mcarthur Rossetti Authorized by: Jean Rosenthal Y   Location:  Knee Site:  R knee Ultrasound Guidance: No   Fluoroscopic Guidance: No   Arthrogram: No   Medications:  3 mL lidocaine 1 %; 40 mg methylPREDNISolone acetate 40 MG/ML Large Joint Inj Date/Time: 10/16/2016 10:05 AM Performed by: Mcarthur Rossetti Authorized by: Mcarthur Rossetti   Location:  Knee Site:  L knee Ultrasound Guidance: No   Fluoroscopic Guidance: No   Arthrogram: No   Medications:  3 mL lidocaine 1 %; 40 mg methylPREDNISolone acetate 40 MG/ML     Clinical Data: No additional findings.   Subjective: No chief complaint on file. The patient is well-known to me. She has bilateral knee medial compartment arthritis that is mild-to-moderate. She is here for steroid injections in both her knees since she is going on a ski trip out [place] with her family is weak. She's had no other oh acute changes and is doing well.  HPI  Review of Systems [place] for recent headache, chest pain, shortness of breath, fever, chills, nausea, vomiting.  Objective: Vital Signs: LMP 09/16/2016   Physical Exam He is alert and oriented 3 and in  no acute distress Ortho Exam Exam patient of both knees show excellent range of motion with medial joint line tenderness and some mild patellofemoral crepitation. Both knees are ligament was a stable. Specialty Comments:  No specialty comments available.  Imaging: No results found.   PMFS History: Patient Active Problem List   Diagnosis Date Noted  . Bilateral chronic knee pain 10/16/2016  . Acquired elevated hemidiaphragm 09/17/2016  . Mediastinal mass 07/24/2015  . Fatty lesion left anterior mediastinum, CTA Chest 06/28/2015 07/05/2015   Past Medical History:  Diagnosis Date  . Arterial thrombosis ([facility])   . BPPV (benign paroxysmal positional vertigo)   . Family history of adverse reaction to anesthesia    Patients mother had headaches and vomiting  . Fracture of wrist    as a child  . Mediastinal mass   . Plantar fasciitis of left foot   . Rosacea     Family History  Problem Relation Age of Onset  . Cancer Mother   . Cancer Paternal Uncle     Past Surgical History:  Procedure Laterality Date  . RESECTION OF MEDIASTINAL MASS N/A 07/24/2015   Procedure: RESECTION OF MEDIASTINAL MASS;  Surgeon: [name], MD;  Location: [facility];  Service: Thoracic;  Laterality: N/A;  . VIDEO ASSISTED THORACOSCOPY Left 07/24/2015   Procedure: VIDEO ASSISTED THORACOSCOPY;  Surgeon: Revonda Standard  Roxan Hockey, MD;  Location: [facility];  Service: Thoracic;  Laterality: Left;  . WISDOM TOOTH EXTRACTION     Social History   Occupational History  . homemaker    Social History Main Topics  . Smoking status: Never Smoker  . Smokeless tobacco: Not on file  . Alcohol use 0.0 oz/week     Comment: social  . Drug use: No  . Sexual activity: Not on file

## 2017-09-11 ENCOUNTER — Other Ambulatory Visit: Payer: Self-pay | Admitting: Thoracic Surgery (Cardiothoracic Vascular Surgery)

## 2017-09-11 DIAGNOSIS — J9859 Other diseases of mediastinum, not elsewhere classified: Secondary | ICD-10-CM

## 2017-09-30 ENCOUNTER — Ambulatory Visit: Payer: 59 | Admitting: Thoracic Surgery (Cardiothoracic Vascular Surgery)

## 2017-09-30 ENCOUNTER — Other Ambulatory Visit: Payer: 59

## 2017-10-28 ENCOUNTER — Ambulatory Visit: Payer: 59 | Admitting: Thoracic Surgery (Cardiothoracic Vascular Surgery)

## 2017-10-28 ENCOUNTER — Other Ambulatory Visit: Payer: Self-pay

## 2017-11-04 ENCOUNTER — Other Ambulatory Visit: Payer: Self-pay

## 2017-11-04 ENCOUNTER — Ambulatory Visit
Admission: RE | Admit: 2017-11-04 | Discharge: 2017-11-04 | Disposition: A | Discharge status: Home / Self Care | Payer: BLUE CROSS/BLUE SHIELD | Source: Ambulatory Visit | Attending: Thoracic Surgery (Cardiothoracic Vascular Surgery) | Admitting: Thoracic Surgery (Cardiothoracic Vascular Surgery)

## 2017-11-04 ENCOUNTER — Ambulatory Visit (INDEPENDENT_AMBULATORY_CARE_PROVIDER_SITE_OTHER): Payer: BLUE CROSS/BLUE SHIELD | Admitting: Thoracic Surgery (Cardiothoracic Vascular Surgery)

## 2017-11-04 VITALS — BP 137/94 | HR 89 | Resp 18 | Ht 67.0 in | Wt 184.6 lb

## 2017-11-04 DIAGNOSIS — Z09 Encounter for follow-up examination after completed treatment for conditions other than malignant neoplasm: Secondary | ICD-10-CM | POA: Diagnosis not present

## 2017-11-04 DIAGNOSIS — J9859 Other diseases of mediastinum, not elsewhere classified: Secondary | ICD-10-CM

## 2017-11-04 DIAGNOSIS — D4989 Neoplasm of unspecified behavior of other specified sites: Secondary | ICD-10-CM | POA: Diagnosis not present

## 2017-11-04 MED ORDER — IOPAMIDOL (ISOVUE-300) INJECTION 61%: 75.0000 mL | Freq: Once | INTRAVENOUS | Status: DC | PRN

## 2017-11-04 NOTE — Progress Notes (Signed)
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  The tumor in case the phrenic nerve which was sacrificed with the resection.  She has an elevated left hemidiaphragm.  She did well postoperatively without any significant complications.  I saw her in the office about a year ago.  She was doing well at that time.  She complained of shortness of breath with exertion and her stamina was not back to her baseline.  She had been a Air cabin crew prior to surgery.  She was not having any pain and did not have any shortness of breath with routine activities  Past Medical History:  Diagnosis Date  . Arterial thrombosis ([place])   . BPPV (benign paroxysmal positional vertigo)   . Family history of adverse reaction to anesthesia    Patients mother had headaches and vomiting  . Fracture of wrist    as a child  . Mediastinal mass   . Plantar fasciitis of left foot   . Rosacea     Current Outpatient Medications  Medication Sig Dispense Refill  . amoxicillin-clavulanate (AUGMENTIN) 500-125 MG tablet Take 1 tablet by mouth 2 (two) times daily.  0  . ferrous sulfate 325 (65 FE) MG tablet Take 325 mg by mouth daily with breakfast.    . METRONIDAZOLE, TOPICAL, 0.75 % LOTN apply to [facility] once daily as directed  0   No current facility-administered medications for this visit.    Facility-Administered Medications Ordered in Other Visits  Medication Dose Route Frequency Provider Last Rate Last Dose  . iopamidol (ISOVUE-300) 61 % injection 75 mL  75 mL Intravenous Once PRN [name], MD        Physical Exam BP (!) 137/94 (BP Location: Left Arm, Patient Position: Sitting, Cuff Size: Normal)   Pulse 89   Resp 18   Ht 5\' 7"  (1.702 m)   Wt 184 lb 9.6 oz (83.7 kg)   SpO2 98%  Comment: RA  BMI 28.30 kg/m  47 year old woman in no acute distress Well-developed well-nourished Alert and oriented x3 with no focal deficits Diminished breath sounds at left base Cardiac regular rate and rhythm normal S1 and S2 Incisions well-healed  Diagnostic Tests: CT CHEST WITH CONTRAST  TECHNIQUE: Multidetector CT imaging of the chest was performed during intravenous contrast administration.  CONTRAST:  Status post mediastinal teratoma resection 2017  COMPARISON:  CT 09/17/2016, chest x-ray 07/24/2015  FINDINGS: Cardiovascular: No significant vascular findings. Normal heart size. No pericardial effusion.  Mediastinum/Nodes: Subtle residual angular fluid density in the anterior mediastinum measuring 2.5 x 1.4 cm at the prior surgical resection site. No nodularity or enhancement. No nodularity along the pericardial surface.  No mediastinal hilar adenopathy.  No supraclavicular adenopathy  Lungs/Pleura: Band of atelectasis in the LEFT upper lobe and superior segment of the LEFT lower lobe not changed from comparison exam. No new nodularity. RIGHT lung is clear.  There is persistent elevation of the LEFT hemidiaphragm. Diaphragm extends to the level of the carina.  Upper Abdomen: Limited view of the liver, kidneys, pancreas are unremarkable. Normal adrenal glands.  Musculoskeletal: No aggressive osseous lesion  IMPRESSION: 1. Postsurgical change in the anterior mediastinum without evidence a  teratoma recurrence. 2. Marked elevation of the LEFT hemidiaphragm and LEFT lung atelectasis unchanged from comparison exam. 3. No pulmonary nodularity.   Electronically Signed   By: Suzy Bouchard M.D.   On: 11/04/2017 11:10 I personally reviewed the CT images and concur with the findings noted above  Impression: Erica Browning is a 47 year old woman who had a large anterior mediastinal terartoma resected a little over 2 years ago.  The tumor did encase  the phrenic nerve and she does have an elevated left hemidiaphragm.  She is doing well.  Her CT shows no evidence of recurrent disease.  Elevated left hemidiaphragm-her physical conditioning is not where it was prior to her surgery.  But her husband says that she did recently go skiing for 3 days without any respiratory issues.  We discussed the results of diaphragm plication which would probably improve her respiratory status by 10-15%.  She will likely not get back to the level of fitness she had when she was playing competitive tennis.  At the present time she does not want to pursue any aggressive measures.  Plan: Return in 2 years with CT chest for final follow-up  [name], MD [facility] [phone]

## 2019-07-01 DIAGNOSIS — R7989 Other specified abnormal findings of blood chemistry: Secondary | ICD-10-CM | POA: Diagnosis not present

## 2019-07-01 DIAGNOSIS — Z0000 Encounter for general adult medical examination without abnormal findings: Secondary | ICD-10-CM | POA: Diagnosis not present

## 2019-07-02 DIAGNOSIS — R82998 Other abnormal findings in urine: Secondary | ICD-10-CM | POA: Diagnosis not present

## 2019-07-08 DIAGNOSIS — Z0000 Encounter for general adult medical examination without abnormal findings: Secondary | ICD-10-CM | POA: Diagnosis not present

## 2019-07-08 DIAGNOSIS — Z1331 Encounter for screening for depression: Secondary | ICD-10-CM | POA: Diagnosis not present

## 2019-07-08 DIAGNOSIS — E669 Obesity, unspecified: Secondary | ICD-10-CM | POA: Diagnosis not present

## 2019-07-08 DIAGNOSIS — M25561 Pain in right knee: Secondary | ICD-10-CM | POA: Diagnosis not present

## 2019-07-08 DIAGNOSIS — R7301 Impaired fasting glucose: Secondary | ICD-10-CM | POA: Diagnosis not present

## 2019-07-08 DIAGNOSIS — Z8603 Personal history of neoplasm of uncertain behavior: Secondary | ICD-10-CM | POA: Diagnosis not present

## 2019-08-23 ENCOUNTER — Other Ambulatory Visit: Payer: Self-pay

## 2019-08-23 DIAGNOSIS — Z20822 Contact with and (suspected) exposure to covid-19: Secondary | ICD-10-CM

## 2019-08-24 LAB — NOVEL CORONAVIRUS, NAA: SARS-CoV-2, NAA: DETECTED — AB

## 2019-09-08 DIAGNOSIS — Z23 Encounter for immunization: Secondary | ICD-10-CM | POA: Diagnosis not present

## 2019-09-08 DIAGNOSIS — M79645 Pain in left finger(s): Secondary | ICD-10-CM | POA: Diagnosis not present

## 2019-09-08 DIAGNOSIS — S61219A Laceration without foreign body of unspecified finger without damage to nail, initial encounter: Secondary | ICD-10-CM | POA: Diagnosis not present

## 2019-09-24 DIAGNOSIS — Z20828 Contact with and (suspected) exposure to other viral communicable diseases: Secondary | ICD-10-CM | POA: Diagnosis not present

## 2019-09-30 ENCOUNTER — Other Ambulatory Visit: Payer: Self-pay | Admitting: Thoracic Surgery (Cardiothoracic Vascular Surgery)

## 2019-09-30 DIAGNOSIS — J9859 Other diseases of mediastinum, not elsewhere classified: Secondary | ICD-10-CM

## 2019-10-28 ENCOUNTER — Other Ambulatory Visit: Payer: BLUE CROSS/BLUE SHIELD

## 2019-11-02 ENCOUNTER — Ambulatory Visit: Payer: BLUE CROSS/BLUE SHIELD | Admitting: Thoracic Surgery (Cardiothoracic Vascular Surgery)

## 2019-11-23 DIAGNOSIS — Z20828 Contact with and (suspected) exposure to other viral communicable diseases: Secondary | ICD-10-CM | POA: Diagnosis not present

## 2020-01-12 DIAGNOSIS — Z23 Encounter for immunization: Secondary | ICD-10-CM | POA: Diagnosis not present

## 2020-07-04 DIAGNOSIS — R7989 Other specified abnormal findings of blood chemistry: Secondary | ICD-10-CM | POA: Diagnosis not present

## 2020-07-04 DIAGNOSIS — Z0000 Encounter for general adult medical examination without abnormal findings: Secondary | ICD-10-CM | POA: Diagnosis not present

## 2020-07-04 DIAGNOSIS — R7301 Impaired fasting glucose: Secondary | ICD-10-CM | POA: Diagnosis not present

## 2020-07-11 DIAGNOSIS — R7301 Impaired fasting glucose: Secondary | ICD-10-CM | POA: Diagnosis not present

## 2020-07-11 DIAGNOSIS — Z1331 Encounter for screening for depression: Secondary | ICD-10-CM | POA: Diagnosis not present

## 2020-07-11 DIAGNOSIS — Z0000 Encounter for general adult medical examination without abnormal findings: Secondary | ICD-10-CM | POA: Diagnosis not present

## 2020-07-11 DIAGNOSIS — R82998 Other abnormal findings in urine: Secondary | ICD-10-CM | POA: Diagnosis not present

## 2020-07-11 DIAGNOSIS — Z23 Encounter for immunization: Secondary | ICD-10-CM | POA: Diagnosis not present

## 2020-07-17 ENCOUNTER — Other Ambulatory Visit: Payer: Self-pay | Admitting: Internal Medicine

## 2020-07-17 DIAGNOSIS — Z1231 Encounter for screening mammogram for malignant neoplasm of breast: Secondary | ICD-10-CM

## 2020-07-31 ENCOUNTER — Ambulatory Visit
Admission: RE | Admit: 2020-07-31 | Discharge: 2020-07-31 | Disposition: A | Discharge status: Home / Self Care | Payer: BLUE CROSS/BLUE SHIELD | Source: Ambulatory Visit | Attending: Internal Medicine | Admitting: Internal Medicine

## 2020-07-31 ENCOUNTER — Other Ambulatory Visit: Payer: Self-pay

## 2020-07-31 DIAGNOSIS — Z1231 Encounter for screening mammogram for malignant neoplasm of breast: Secondary | ICD-10-CM

## 2021-07-19 DIAGNOSIS — R7989 Other specified abnormal findings of blood chemistry: Secondary | ICD-10-CM | POA: Diagnosis not present

## 2021-07-19 DIAGNOSIS — R7301 Impaired fasting glucose: Secondary | ICD-10-CM | POA: Diagnosis not present

## 2021-07-19 DIAGNOSIS — Z0000 Encounter for general adult medical examination without abnormal findings: Secondary | ICD-10-CM | POA: Diagnosis not present

## 2021-07-30 DIAGNOSIS — Z0000 Encounter for general adult medical examination without abnormal findings: Secondary | ICD-10-CM | POA: Diagnosis not present

## 2021-07-30 DIAGNOSIS — R7989 Other specified abnormal findings of blood chemistry: Secondary | ICD-10-CM | POA: Diagnosis not present

## 2021-07-30 DIAGNOSIS — R82998 Other abnormal findings in urine: Secondary | ICD-10-CM | POA: Diagnosis not present

## 2021-07-30 DIAGNOSIS — Z1389 Encounter for screening for other disorder: Secondary | ICD-10-CM | POA: Diagnosis not present

## 2021-07-30 DIAGNOSIS — Z1331 Encounter for screening for depression: Secondary | ICD-10-CM | POA: Diagnosis not present

## 2021-07-30 DIAGNOSIS — L650 Telogen effluvium: Secondary | ICD-10-CM | POA: Diagnosis not present

## 2021-07-30 DIAGNOSIS — Z23 Encounter for immunization: Secondary | ICD-10-CM | POA: Diagnosis not present

## 2021-07-30 DIAGNOSIS — R7301 Impaired fasting glucose: Secondary | ICD-10-CM | POA: Diagnosis not present

## 2021-08-07 DIAGNOSIS — D2272 Melanocytic nevi of left lower limb, including hip: Secondary | ICD-10-CM | POA: Diagnosis not present

## 2021-08-07 DIAGNOSIS — L821 Other seborrheic keratosis: Secondary | ICD-10-CM | POA: Diagnosis not present

## 2021-08-07 DIAGNOSIS — L820 Inflamed seborrheic keratosis: Secondary | ICD-10-CM | POA: Diagnosis not present

## 2021-08-07 DIAGNOSIS — L570 Actinic keratosis: Secondary | ICD-10-CM | POA: Diagnosis not present

## 2021-08-10 IMAGING — MG DIGITAL SCREENING BILAT W/ TOMO W/ CAD
8 series · 8 of 24 positions shown · non-contrast
Comparison: Previous exam(s).

CLINICAL DATA: Screening.

EXAM:
DIGITAL SCREENING BILATERAL MAMMOGRAM WITH TOMO AND CAD

[R MLO synth-2D]
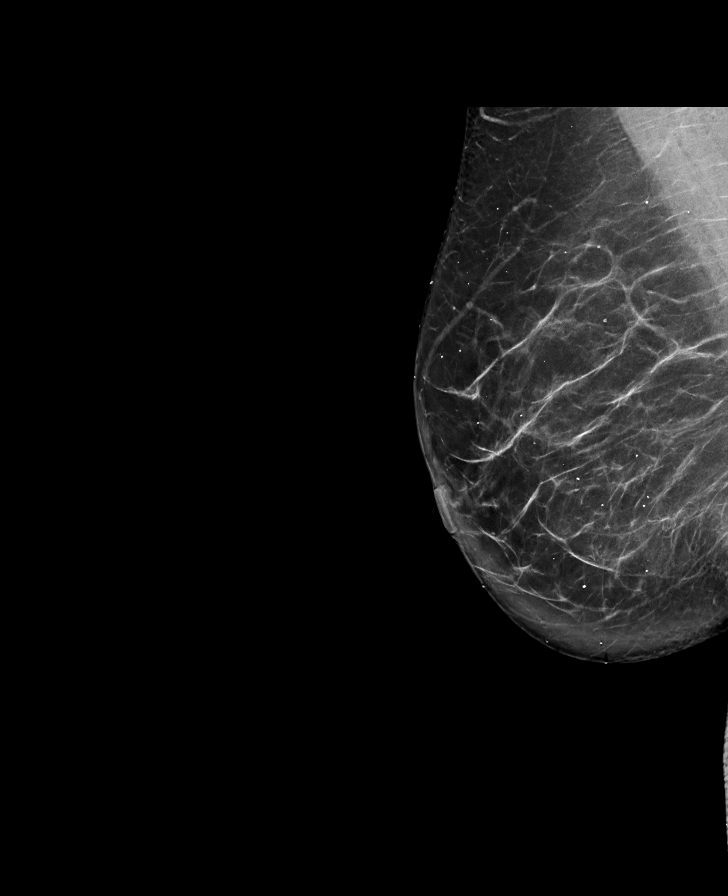

[L CC synth-2D]
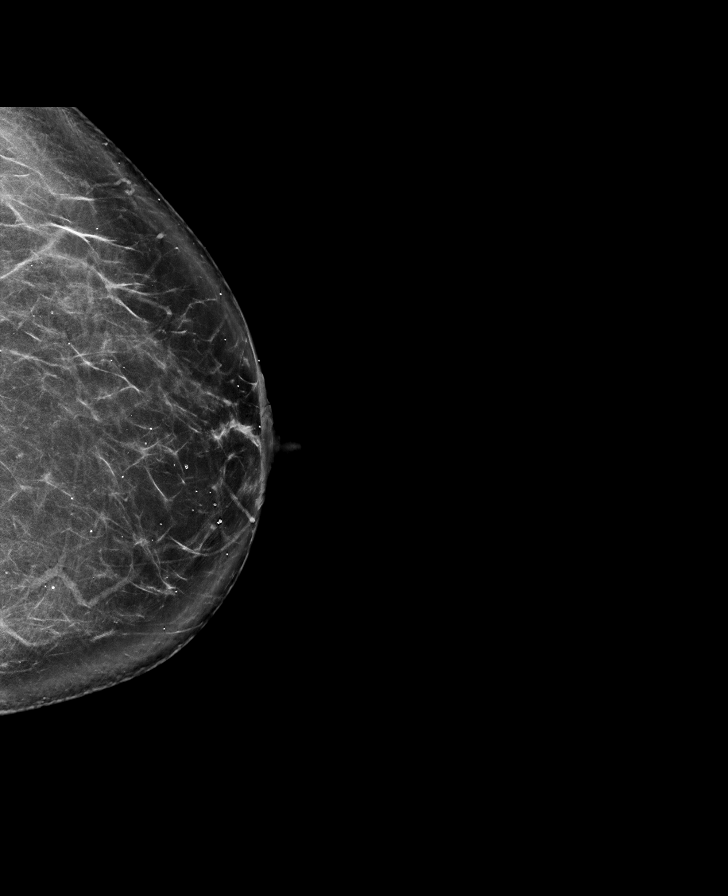

[L MLO synth-2D]
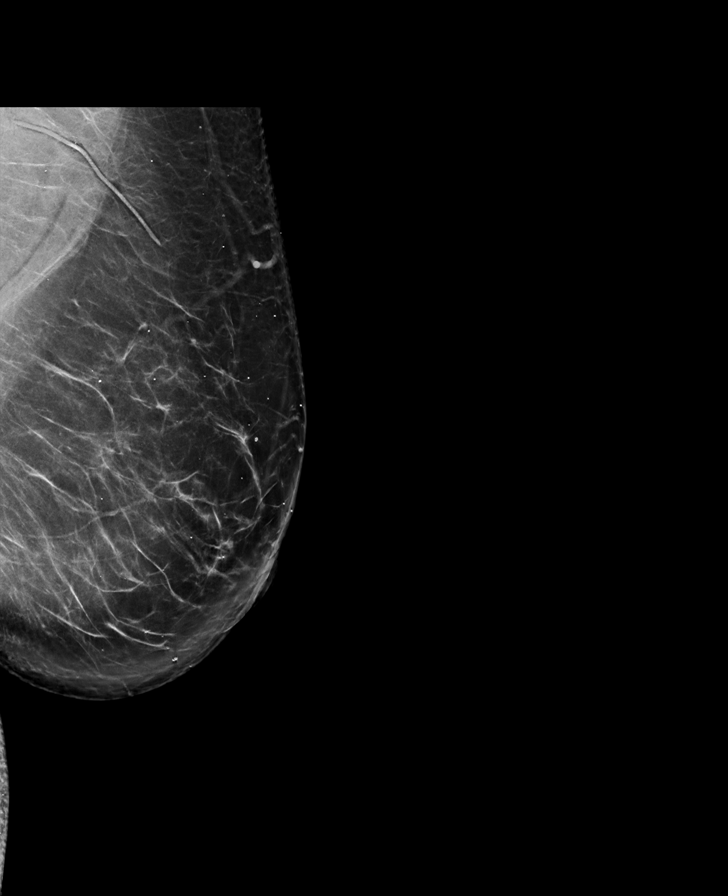

[R CC synth-2D]
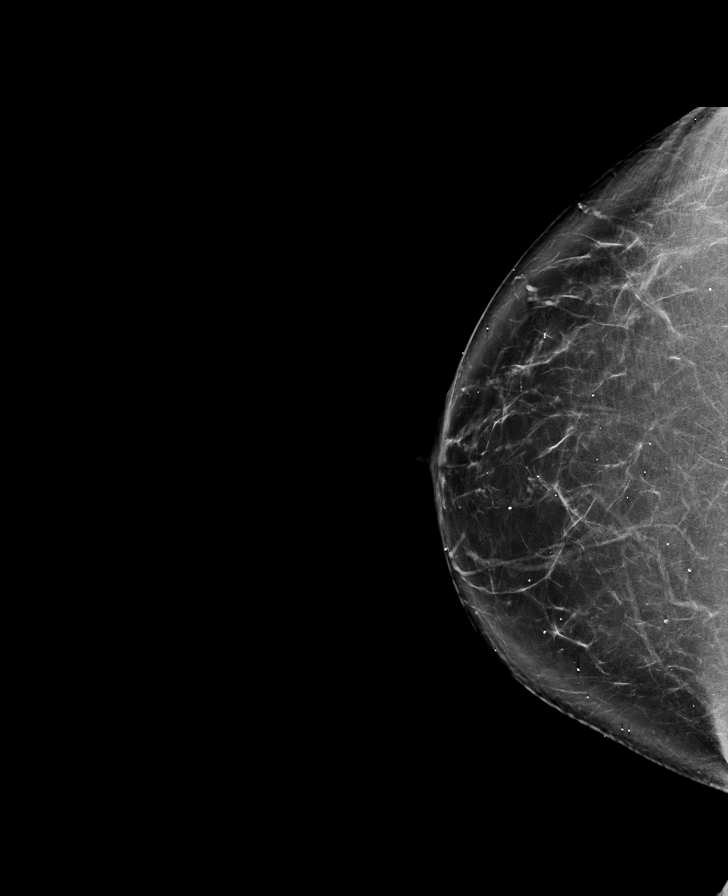

[R CC tomo · tomo slice 47/92.0]
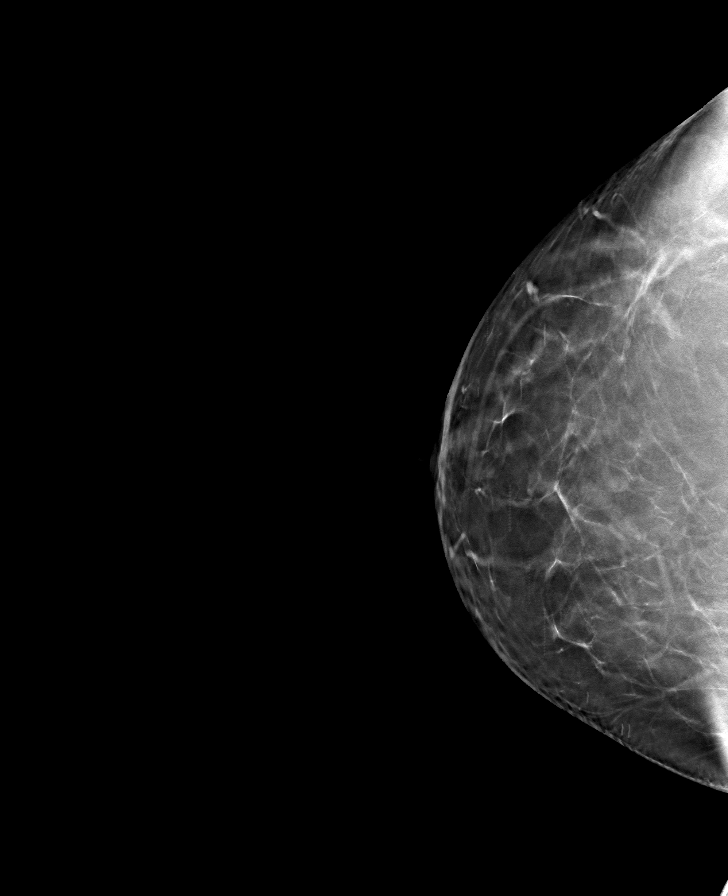

[R MLO tomo · tomo slice 47/93.0]
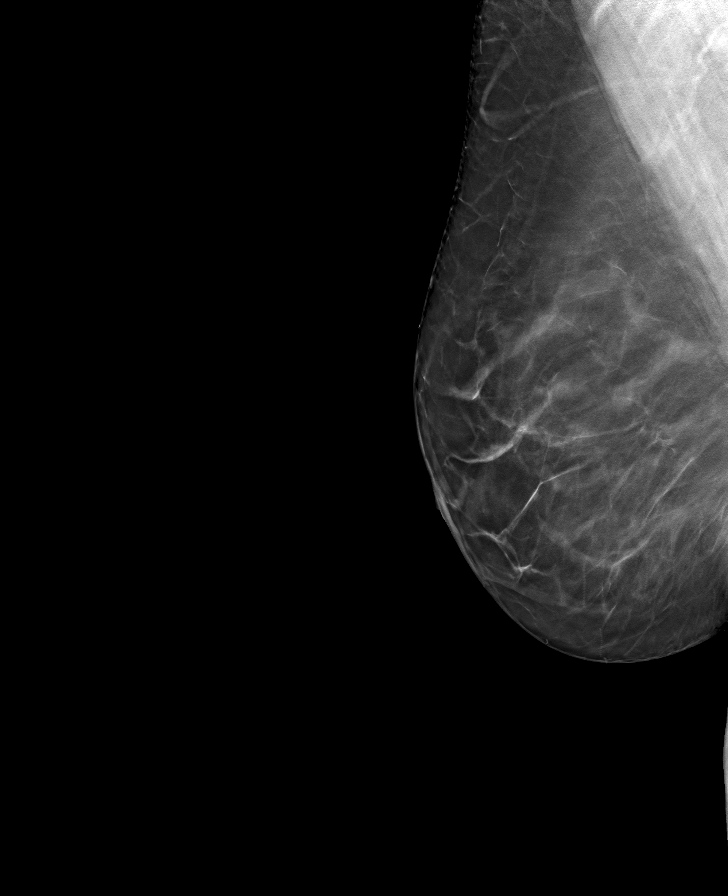

[L CC tomo · tomo slice 44/87.0]
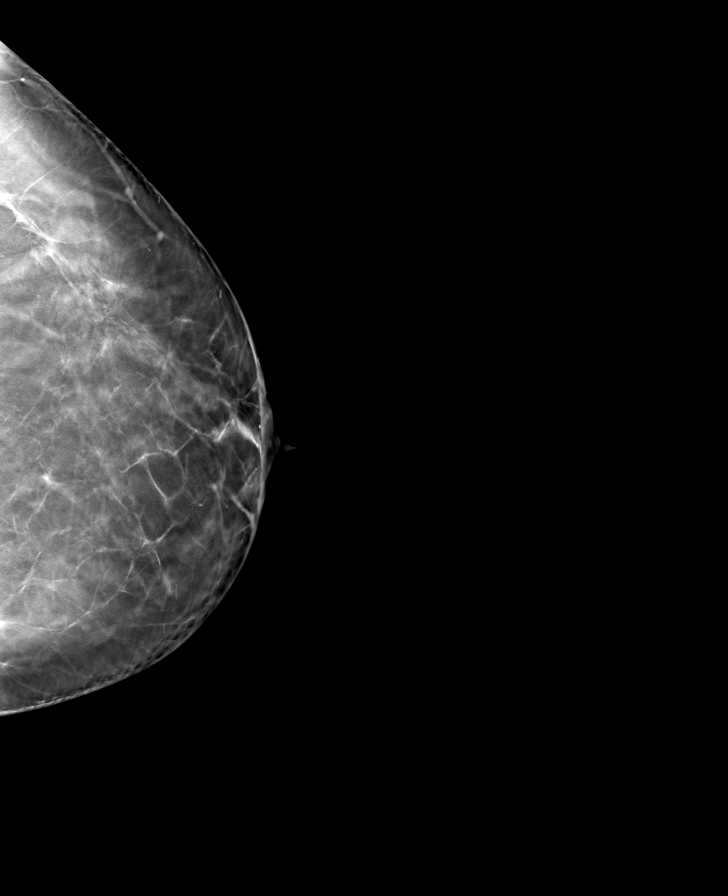

[L MLO tomo · tomo slice 50/99.0]
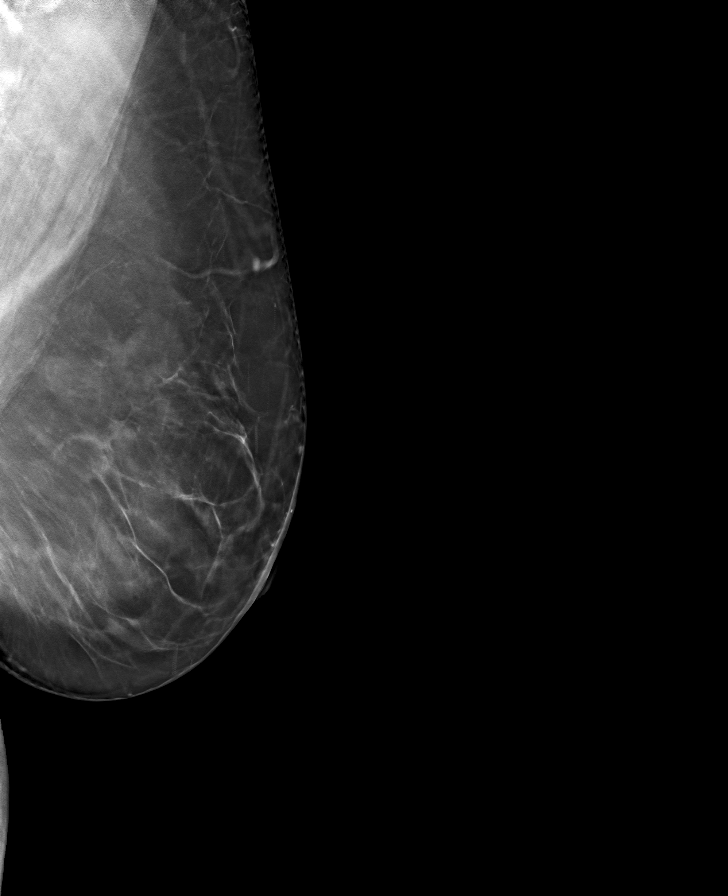

[8 of 24 positions shown; findings below may reference images not displayed]

ACR Breast Density Category b: There are scattered areas of
fibroglandular density.
FINDINGS: There are no findings suspicious for malignancy. Images were
processed with CAD.
IMPRESSION: No mammographic evidence of malignancy. A result letter of this
screening mammogram will be mailed directly to the patient.

RECOMMENDATION:
Screening mammogram in one year. (Code:[id number])

BI-RADS CATEGORY  1: Negative.

## 2022-06-10 DIAGNOSIS — H01001 Unspecified blepharitis right upper eyelid: Secondary | ICD-10-CM | POA: Diagnosis not present

## 2022-06-10 DIAGNOSIS — H01004 Unspecified blepharitis left upper eyelid: Secondary | ICD-10-CM | POA: Diagnosis not present

## 2022-06-10 DIAGNOSIS — H52203 Unspecified astigmatism, bilateral: Secondary | ICD-10-CM | POA: Diagnosis not present

## 2022-08-08 ENCOUNTER — Other Ambulatory Visit: Payer: Self-pay | Admitting: Internal Medicine

## 2022-08-08 DIAGNOSIS — Z1231 Encounter for screening mammogram for malignant neoplasm of breast: Secondary | ICD-10-CM

## 2022-08-15 DIAGNOSIS — R7301 Impaired fasting glucose: Secondary | ICD-10-CM | POA: Diagnosis not present

## 2022-08-15 DIAGNOSIS — Z0000 Encounter for general adult medical examination without abnormal findings: Secondary | ICD-10-CM | POA: Diagnosis not present

## 2022-08-15 DIAGNOSIS — R7989 Other specified abnormal findings of blood chemistry: Secondary | ICD-10-CM | POA: Diagnosis not present

## 2022-08-20 ENCOUNTER — Ambulatory Visit
Admission: RE | Admit: 2022-08-20 | Discharge: 2022-08-20 | Disposition: A | Discharge status: Home / Self Care | Payer: BC Managed Care – PPO | Source: Ambulatory Visit | Attending: Internal Medicine | Admitting: Internal Medicine

## 2022-08-20 DIAGNOSIS — Z1231 Encounter for screening mammogram for malignant neoplasm of breast: Secondary | ICD-10-CM | POA: Diagnosis not present

## 2022-08-21 DIAGNOSIS — Z01419 Encounter for gynecological examination (general) (routine) without abnormal findings: Secondary | ICD-10-CM | POA: Diagnosis not present

## 2022-08-21 DIAGNOSIS — Z124 Encounter for screening for malignant neoplasm of cervix: Secondary | ICD-10-CM | POA: Diagnosis not present

## 2022-08-21 DIAGNOSIS — N898 Other specified noninflammatory disorders of vagina: Secondary | ICD-10-CM | POA: Diagnosis not present

## 2022-08-21 DIAGNOSIS — Z6830 Body mass index (BMI) 30.0-30.9, adult: Secondary | ICD-10-CM | POA: Diagnosis not present

## 2022-08-22 DIAGNOSIS — R82998 Other abnormal findings in urine: Secondary | ICD-10-CM | POA: Diagnosis not present

## 2022-08-22 DIAGNOSIS — Z1339 Encounter for screening examination for other mental health and behavioral disorders: Secondary | ICD-10-CM | POA: Diagnosis not present

## 2022-08-22 DIAGNOSIS — D649 Anemia, unspecified: Secondary | ICD-10-CM | POA: Diagnosis not present

## 2022-08-22 DIAGNOSIS — R7301 Impaired fasting glucose: Secondary | ICD-10-CM | POA: Diagnosis not present

## 2022-08-22 DIAGNOSIS — Z1331 Encounter for screening for depression: Secondary | ICD-10-CM | POA: Diagnosis not present

## 2022-08-22 DIAGNOSIS — Z0000 Encounter for general adult medical examination without abnormal findings: Secondary | ICD-10-CM | POA: Diagnosis not present

## 2022-08-22 DIAGNOSIS — E611 Iron deficiency: Secondary | ICD-10-CM | POA: Diagnosis not present

## 2022-10-09 ENCOUNTER — Other Ambulatory Visit: Payer: Self-pay | Admitting: Internal Medicine

## 2022-10-09 DIAGNOSIS — Z8603 Personal history of neoplasm of uncertain behavior: Secondary | ICD-10-CM

## 2023-03-12 DIAGNOSIS — L700 Acne vulgaris: Secondary | ICD-10-CM | POA: Diagnosis not present

## 2023-03-12 DIAGNOSIS — L570 Actinic keratosis: Secondary | ICD-10-CM | POA: Diagnosis not present

## 2023-03-12 DIAGNOSIS — L821 Other seborrheic keratosis: Secondary | ICD-10-CM | POA: Diagnosis not present

## 2023-03-12 DIAGNOSIS — L738 Other specified follicular disorders: Secondary | ICD-10-CM | POA: Diagnosis not present

## 2023-03-12 DIAGNOSIS — L820 Inflamed seborrheic keratosis: Secondary | ICD-10-CM | POA: Diagnosis not present

## 2023-03-12 DIAGNOSIS — L819 Disorder of pigmentation, unspecified: Secondary | ICD-10-CM | POA: Diagnosis not present

## 2023-06-12 DIAGNOSIS — H0100A Unspecified blepharitis right eye, upper and lower eyelids: Secondary | ICD-10-CM | POA: Diagnosis not present

## 2023-06-12 DIAGNOSIS — H0100B Unspecified blepharitis left eye, upper and lower eyelids: Secondary | ICD-10-CM | POA: Diagnosis not present

## 2023-06-12 DIAGNOSIS — H52203 Unspecified astigmatism, bilateral: Secondary | ICD-10-CM | POA: Diagnosis not present

## 2023-09-02 ENCOUNTER — Other Ambulatory Visit: Payer: Self-pay | Admitting: Internal Medicine

## 2023-09-02 DIAGNOSIS — Z1231 Encounter for screening mammogram for malignant neoplasm of breast: Secondary | ICD-10-CM

## 2023-09-16 DIAGNOSIS — R7301 Impaired fasting glucose: Secondary | ICD-10-CM | POA: Diagnosis not present

## 2023-09-16 DIAGNOSIS — E785 Hyperlipidemia, unspecified: Secondary | ICD-10-CM | POA: Diagnosis not present

## 2023-09-16 DIAGNOSIS — E611 Iron deficiency: Secondary | ICD-10-CM | POA: Diagnosis not present

## 2023-09-23 DIAGNOSIS — R82998 Other abnormal findings in urine: Secondary | ICD-10-CM | POA: Diagnosis not present

## 2023-09-23 DIAGNOSIS — Z0000 Encounter for general adult medical examination without abnormal findings: Secondary | ICD-10-CM | POA: Diagnosis not present

## 2023-09-23 DIAGNOSIS — R030 Elevated blood-pressure reading, without diagnosis of hypertension: Secondary | ICD-10-CM | POA: Diagnosis not present

## 2023-09-24 ENCOUNTER — Other Ambulatory Visit: Payer: Self-pay | Admitting: Internal Medicine

## 2023-09-24 DIAGNOSIS — E785 Hyperlipidemia, unspecified: Secondary | ICD-10-CM

## 2023-09-26 DIAGNOSIS — E611 Iron deficiency: Secondary | ICD-10-CM | POA: Diagnosis not present

## 2023-10-22 ENCOUNTER — Ambulatory Visit: Payer: BC Managed Care – PPO

## 2023-10-22 ENCOUNTER — Encounter: Payer: Self-pay | Admitting: Gastroenterology

## 2023-10-24 ENCOUNTER — Ambulatory Visit: Payer: BC Managed Care – PPO

## 2023-10-27 ENCOUNTER — Other Ambulatory Visit: Payer: BC Managed Care – PPO

## 2023-11-11 ENCOUNTER — Ambulatory Visit
Admission: RE | Admit: 2023-11-11 | Discharge: 2023-11-11 | Disposition: A | Discharge status: Home / Self Care | Payer: BC Managed Care – PPO | Source: Ambulatory Visit | Attending: Internal Medicine | Admitting: Internal Medicine

## 2023-11-11 DIAGNOSIS — Z1231 Encounter for screening mammogram for malignant neoplasm of breast: Secondary | ICD-10-CM | POA: Diagnosis not present

## 2023-11-12 ENCOUNTER — Ambulatory Visit
Admission: RE | Admit: 2023-11-12 | Discharge: 2023-11-12 | Disposition: A | Discharge status: Home / Self Care | Payer: BC Managed Care – PPO | Source: Ambulatory Visit | Attending: Internal Medicine | Admitting: Internal Medicine

## 2023-11-12 DIAGNOSIS — E785 Hyperlipidemia, unspecified: Secondary | ICD-10-CM | POA: Diagnosis not present

## 2023-12-01 ENCOUNTER — Ambulatory Visit (INDEPENDENT_AMBULATORY_CARE_PROVIDER_SITE_OTHER): Payer: BC Managed Care – PPO | Admitting: Gastroenterology

## 2023-12-01 ENCOUNTER — Telehealth: Payer: Self-pay

## 2023-12-01 ENCOUNTER — Other Ambulatory Visit (INDEPENDENT_AMBULATORY_CARE_PROVIDER_SITE_OTHER): Payer: BC Managed Care – PPO

## 2023-12-01 ENCOUNTER — Encounter: Payer: Self-pay | Admitting: Gastroenterology

## 2023-12-01 VITALS — BP 118/82 | HR 106 | Ht 67.0 in | Wt 190.4 lb

## 2023-12-01 DIAGNOSIS — E611 Iron deficiency: Secondary | ICD-10-CM

## 2023-12-01 DIAGNOSIS — R1010 Upper abdominal pain, unspecified: Secondary | ICD-10-CM | POA: Diagnosis not present

## 2023-12-01 DIAGNOSIS — R195 Other fecal abnormalities: Secondary | ICD-10-CM

## 2023-12-01 DIAGNOSIS — R1011 Right upper quadrant pain: Secondary | ICD-10-CM

## 2023-12-01 DIAGNOSIS — R1013 Epigastric pain: Secondary | ICD-10-CM | POA: Diagnosis not present

## 2023-12-01 DIAGNOSIS — K8020 Calculus of gallbladder without cholecystitis without obstruction: Secondary | ICD-10-CM

## 2023-12-01 LAB — IBC + FERRITIN
Ferritin: 34.3 ng/mL (ref 10.0–291.0)
Iron: 96 ug/dL (ref 42–145)
Saturation Ratios: 25.9 % (ref 20.0–50.0)
TIBC: 371 ug/dL (ref 250.0–450.0)
Transferrin: 265 mg/dL (ref 212.0–360.0)

## 2023-12-01 LAB — COMPREHENSIVE METABOLIC PANEL
ALT: 15 U/L (ref 0–35)
AST: 17 U/L (ref 0–37)
Albumin: 4.5 g/dL (ref 3.5–5.2)
Alkaline Phosphatase: 75 U/L (ref 39–117)
BUN: 14 mg/dL (ref 6–23)
CO2: 27 meq/L (ref 19–32)
Calcium: 9.8 mg/dL (ref 8.4–10.5)
Chloride: 103 meq/L (ref 96–112)
Creatinine, Ser: 0.63 mg/dL (ref 0.40–1.20)
GFR: 101.06 mL/min (ref >60.00)
Glucose, Bld: 110 mg/dL — ABNORMAL HIGH (ref 70–99)
Potassium: 4.3 meq/L (ref 3.5–5.1)
Sodium: 139 meq/L (ref 135–145)
Total Bilirubin: 0.4 mg/dL (ref 0.2–1.2)
Total Protein: 8.1 g/dL (ref 6.0–8.3)

## 2023-12-01 LAB — CBC
HCT: 47.9 % — ABNORMAL HIGH (ref 36.0–46.0)
Hemoglobin: 16.2 g/dL — ABNORMAL HIGH (ref 12.0–15.0)
MCHC: 33.8 g/dL (ref 30.0–36.0)
MCV: 91.8 fL (ref 78.0–100.0)
Platelets: 319 10*3/uL (ref 150.0–400.0)
RBC: 5.21 Mil/uL — ABNORMAL HIGH (ref 3.87–5.11)
RDW: 13.8 % (ref 11.5–15.5)
WBC: 4.4 10*3/uL (ref 4.0–10.5)

## 2023-12-01 MED ORDER — NA SULFATE-K SULFATE-MG SULF 17.5-3.13-1.6 GM/177ML PO SOLN: 1.0000 | Freq: Once | ORAL | 0 refills | Status: AC

## 2023-12-01 NOTE — Progress Notes (Signed)
 Chief Complaint: Heme positive stool and iron deficiency Primary GI MD: Gentry Fitz  HPI: 53 year old female with medical history as listed below presents for evaluation of heme positive stool and iron deficiency  Patient referred here by [facility] for Hemoccult positive stool  Labs 09/16/2023: Normal CMP Hgb 16.1, MCV 92, RDW 13.9 Ferritin 15 (9.3 in 2022) Iron 102, TIBC 356, saturation 29% Normal TSH  She has a history of iron deficiency and heme positive stool. She has not undergone a colonoscopy due to anxiety about medical procedures. No visible bleeding or changes in bowel habits, but she experiences an upset stomach. She has been prescribed iron pills but finds the schedule challenging and has not been taking them. She is not currently on any blood thinners and has no history of anemia, despite the iron deficiency.  She recalls a sonogram from 2017 that showed gallstones. She experiences upper abdominal pain, particularly after consuming fatty or greasy foods like steak. The pain occurs approximately twice a week and is described as a pressing sensation in the upper abdomen, sometimes feeling like it is moving. No heartburn or trouble swallowing. She has not pursued surgery for gallstones due to previous negative experiences with surgery.  She expresses anxiety about undergoing medical procedures, which has contributed to her avoidance of a colonoscopy.  She would like to know if she could be prescribed a low-dose Xanax prior to the procedure  Denies family history of colon cancer, change in bowel habits, melena, hematochezia, weight loss     Past Medical History:  Diagnosis Date   Arterial thrombosis ([facility])    BPPV (benign paroxysmal positional vertigo)    Family history of adverse reaction to anesthesia    Patients mother had headaches and vomiting   Fracture of wrist    as a child   Mediastinal mass    Plantar fasciitis of left foot    Rosacea     Past  Surgical History:  Procedure Laterality Date   RESECTION OF MEDIASTINAL MASS N/A 07/24/2015   Procedure: RESECTION OF MEDIASTINAL MASS;  Surgeon: Loreli Slot, MD;  Location: [facility];  Service: Thoracic;  Laterality: N/A;   VIDEO ASSISTED THORACOSCOPY Left 07/24/2015   Procedure: VIDEO ASSISTED THORACOSCOPY;  Surgeon: Loreli Slot, MD;  Location: [facility];  Service: Thoracic;  Laterality: Left;   WISDOM TOOTH EXTRACTION      Current Outpatient Medications  Medication Sig Dispense Refill   ferrous sulfate 325 (65 FE) MG tablet Take 325 mg by mouth daily with breakfast.     Multiple Vitamin (MULTIVITAMIN ADULT PO)      Na Sulfate-K Sulfate-Mg Sulfate concentrate 17.5-3.13-1.6 GM/177ML SOLN Take 1 kit by mouth once for 1 dose. 354 mL 0   amoxicillin-clavulanate (AUGMENTIN) 500-125 MG tablet Take 1 tablet by mouth 2 (two) times daily. (Patient not taking: Reported on 12/01/2023)  0   METRONIDAZOLE, TOPICAL, 0.75 % LOTN apply to [facility] once daily as directed (Patient not taking: Reported on 12/01/2023)  0   No current facility-administered medications for this visit.    Allergies as of 12/01/2023 - Review Complete 12/01/2023  Allergen Reaction Noted   Mango flavoring agent (non-screening) Other (See Comments) 07/20/2015    Family History  Problem Relation Age of Onset   Cancer Mother    Breast cancer Mother 67       and 13    Cancer Paternal Uncle     Social History   Socioeconomic History   Marital status: Married  Spouse name: Not on file   Number of children: 2   Years of education: Not on file   Highest education level: Not on file  Occupational History   Occupation: homemaker  Tobacco Use   Smoking status: Never   Smokeless tobacco: Not on file  Substance and Sexual Activity   Alcohol use: Yes    Alcohol/week: 0.0 standard drinks of alcohol    Comment: social   Drug use: No   Sexual activity: Not on file  Other Topics Concern   Not on file  Social  History Narrative   Not on file   Social Drivers of Health   Financial Resource Strain: Not on file  Food Insecurity: Not on file  Transportation Needs: Not on file  Physical Activity: Not on file  Stress: Not on file  Social Connections: Not on file  Intimate Partner Violence: Not on file    Review of Systems:    Constitutional: No weight loss, fever, chills, weakness or fatigue HEENT: Eyes: No change in vision               Ears, Nose, Throat:  No change in hearing or congestion Skin: No rash or itching Cardiovascular: No chest pain, chest pressure or palpitations   Respiratory: No SOB or cough Gastrointestinal: See HPI and otherwise negative Genitourinary: No dysuria or change in urinary frequency Neurological: No headache, dizziness or syncope Musculoskeletal: No new muscle or joint pain Hematologic: No bleeding or bruising Psychiatric: No history of depression or anxiety    Physical Exam:  Vital signs: BP 118/82   Pulse (!) 106   Ht 5\' 7"  (1.702 m)   Wt 190 lb 6 oz (86.4 kg)   LMP 06/15/2023   BMI 29.82 kg/m   Constitutional: NAD, Well developed, Well nourished, alert and cooperative Head:  Normocephalic and atraumatic. Eyes:   PEERL, EOMI. No icterus. Conjunctiva pink. Respiratory: Respirations even and unlabored. Lungs clear to auscultation bilaterally.   No wheezes, crackles, or rhonchi.  Cardiovascular:  Regular rate and rhythm. No peripheral edema, cyanosis or pallor.  Gastrointestinal:  Soft, nondistended, nontender. No rebound or guarding. Normal bowel sounds. No appreciable masses or hepatomegaly. Rectal:  Not performed.  Msk:  Symmetrical without gross deformities. Without edema, no deformity or joint abnormality.  Neurologic:  Alert and  oriented x4;  grossly normal neurologically.  Skin:   Dry and intact without significant lesions or rashes. Psychiatric: Oriented to person, place and time. Demonstrates good judgement and reason without abnormal affect  or behaviors.   RELEVANT LABS AND IMAGING: CBC    Component Value Date/Time   WBC 5.4 07/26/2015 0525   RBC 3.51 (L) 07/26/2015 0525   HGB 10.4 (L) 07/26/2015 0525   HCT 32.1 (L) 07/26/2015 0525   PLT 210 07/26/2015 0525   MCV 91.5 07/26/2015 0525   MCH 29.6 07/26/2015 0525   MCHC 32.4 07/26/2015 0525   RDW 13.5 07/26/2015 0525    CMP     Component Value Date/Time   NA 134 (L) 07/28/2015 0315   K 4.1 07/28/2015 0315   CL 99 (L) 07/28/2015 0315   CO2 26 07/28/2015 0315   GLUCOSE 101 (H) 07/28/2015 0315   BUN 6 07/28/2015 0315   CREATININE 0.57 07/28/2015 0315   CALCIUM 8.7 (L) 07/28/2015 0315   PROT 4.9 (L) 07/26/2015 0525   ALBUMIN 2.3 (L) 07/26/2015 0525   AST 43 (H) 07/26/2015 0525   ALT 81 (H) 07/26/2015 0525   ALKPHOS 51 07/26/2015  0525   BILITOT 0.6 07/26/2015 0525   GFRNONAA >60 07/28/2015 0315   GFRAA >60 07/28/2015 0315     Assessment/Plan:      Iron Deficiency Heme positive stool Iron deficiency and heme positive stool without overt bleeding or change in bowel habits. No history of colonoscopy. - EGD/colonoscopy - I thoroughly discussed the procedure with the patient (at bedside) to include nature of the procedure, alternatives, benefits, and risks (including but not limited to bleeding, infection, perforation, anesthesia/cardiac pulmonary complications).  Patient verbalized understanding and gave verbal consent to proceed with procedure.  -- Continue iron supplementation, advised to take with orange juice for better absorption. -- CBC, CMP, iron studies today - Further management per procedure findings  Anxiety about medical procedures Will discuss with Cathlyn Parsons CRNA if patient can have low dose Xanax prior to procedure to help with her anxiety  Gallstones Upper abdominal pain History of gallstones with intermittent upper abdominal pain, particularly after eating fatty foods. No change in bowel habits or jaundice.  No GERD. --Order abdominal  ultrasound to evaluate current status of gallstones. --Discuss potential surgical intervention with patient, depending on ultrasound results.   Lara [facility]ch [facility] 12/01/2023, 10:05 AM  Cc: Cleatis Polka., MD

## 2023-12-01 NOTE — Telephone Encounter (Signed)
-----   Message from Legrand Como sent at 12/01/2023  1:23 PM EST ----- Please let patient know her lab results have returned.  Normal kidneys, liver, electrolytes.  No evidence of anemia.  Her iron studies are actually normal so she does not need to take the iron supplement at this time since she has not taken it yet.  We will proceed with procedures

## 2023-12-01 NOTE — Patient Instructions (Addendum)
 You have been scheduled for an abdominal ultrasound at [facility] (1st floor of hospital) on Wednesday 12/03/23 at 8:30 am. Please arrive 30 minutes prior to your appointment for registration. Make certain not to have anything to eat or drink 6 hours prior to your appointment. Should you need to reschedule your appointment, please contact radiology at [phone]. This test typically takes about 30 minutes to perform.  Your provider has requested that you go to the basement level for lab work before leaving today. Press "B" on the elevator. The lab is located at the first door on the left as you exit the elevator.  You have been scheduled for an endoscopy and colonoscopy. Please follow the written instructions given to you at your visit today.  If you use inhalers (even only as needed), please bring them with you on the day of your procedure.  DO NOT TAKE 7 DAYS PRIOR TO TEST- Trulicity (dulaglutide) Ozempic, Wegovy (semaglutide) Mounjaro (tirzepatide) Bydureon Bcise (exanatide extended release)  DO NOT TAKE 1 DAY PRIOR TO YOUR TEST Rybelsus (semaglutide) Adlyxin (lixisenatide) Victoza (liraglutide) Byetta (exanatide) _______________________________________________________  If your blood pressure at your visit was 140/90 or greater, please contact your primary care physician to follow up on this.  _______________________________________________________  If you are age 41 or older, your body mass index should be between 23-30. Your Body mass index is 29.82 kg/m. If this is out of the aforementioned range listed, please consider follow up with your Primary Care Provider.  If you are age 33 or younger, your body mass index should be between 19-25. Your Body mass index is 29.82 kg/m. If this is out of the aformentioned range listed, please consider follow up with your Primary Care Provider.   ________________________________________________________  The [facility] providers  would like to encourage you to use [facility] to communicate with providers for non-urgent requests or questions.  Due to long hold times on the telephone, sending your provider a message by [facility] may be a faster and more efficient way to get a response.  Please allow 48 business hours for a response.  Please remember that this is for non-urgent requests.  _______________________________________________________

## 2023-12-01 NOTE — Telephone Encounter (Signed)
 I spoke to [place] and I advised her of her lab results.  Patient was happy that her results were good.

## 2023-12-03 ENCOUNTER — Ambulatory Visit (HOSPITAL_COMMUNITY)
Admission: RE | Admit: 2023-12-03 | Discharge: 2023-12-03 | Disposition: A | Discharge status: Home / Self Care | Payer: BC Managed Care – PPO | Source: Ambulatory Visit | Attending: Gastroenterology | Admitting: Gastroenterology

## 2023-12-03 DIAGNOSIS — R1013 Epigastric pain: Secondary | ICD-10-CM | POA: Insufficient documentation

## 2023-12-03 DIAGNOSIS — R1011 Right upper quadrant pain: Secondary | ICD-10-CM | POA: Insufficient documentation

## 2023-12-03 DIAGNOSIS — K8020 Calculus of gallbladder without cholecystitis without obstruction: Secondary | ICD-10-CM | POA: Diagnosis not present

## 2023-12-08 ENCOUNTER — Encounter: Payer: Self-pay | Admitting: Gastroenterology

## 2023-12-08 ENCOUNTER — Ambulatory Visit (AMBULATORY_SURGERY_CENTER): Payer: BC Managed Care – PPO | Admitting: Gastroenterology

## 2023-12-08 ENCOUNTER — Encounter: Payer: BC Managed Care – PPO | Admitting: Gastroenterology

## 2023-12-08 VITALS — BP 116/60 | HR 84 | Temp 98.0°F | Resp 16

## 2023-12-08 DIAGNOSIS — R195 Other fecal abnormalities: Secondary | ICD-10-CM | POA: Diagnosis not present

## 2023-12-08 DIAGNOSIS — D509 Iron deficiency anemia, unspecified: Secondary | ICD-10-CM | POA: Diagnosis not present

## 2023-12-08 DIAGNOSIS — K3189 Other diseases of stomach and duodenum: Secondary | ICD-10-CM | POA: Diagnosis not present

## 2023-12-08 DIAGNOSIS — D120 Benign neoplasm of cecum: Secondary | ICD-10-CM

## 2023-12-08 DIAGNOSIS — E611 Iron deficiency: Secondary | ICD-10-CM

## 2023-12-08 DIAGNOSIS — R1011 Right upper quadrant pain: Secondary | ICD-10-CM

## 2023-12-08 DIAGNOSIS — K8020 Calculus of gallbladder without cholecystitis without obstruction: Secondary | ICD-10-CM

## 2023-12-08 DIAGNOSIS — R1013 Epigastric pain: Secondary | ICD-10-CM

## 2023-12-08 DIAGNOSIS — K2950 Unspecified chronic gastritis without bleeding: Secondary | ICD-10-CM | POA: Diagnosis not present

## 2023-12-08 MED ORDER — SODIUM CHLORIDE 0.9 % IV SOLN: 500.0000 mL | INTRAVENOUS | Status: DC

## 2023-12-08 NOTE — Progress Notes (Signed)
 Report to PACU, RN, vss, BBS= Clear.

## 2023-12-08 NOTE — Progress Notes (Signed)
 Called to room to assist during endoscopic procedure.  Patient ID and intended procedure confirmed with present staff. Received instructions for my participation in the procedure from the performing physician.

## 2023-12-08 NOTE — Progress Notes (Signed)
 GASTROENTEROLOGY PROCEDURE H&P NOTE   Primary Care Physician: Cleatis Polka., MD    Reason for Procedure:  Iron deficiency anemia, heme positive stool  Plan:    EGD, colonoscopy  Patient is appropriate for endoscopic procedure(s) in the ambulatory ([facility]) setting.  The nature of the procedure, as well as the risks, benefits, and alternatives were carefully and thoroughly reviewed with the patient. Ample time for discussion and questions allowed. The patient understood, was satisfied, and agreed to proceed.     HPI: Erica Browning is a 53 y.o. female who presents for EGD and colonoscopy for evaluation of iron deficiency anemia.  Patient was most recently seen in the Gastroenterology Clinic on 12/01/2023.  No interval change in medical history since that appointment. Please refer to that note for full details regarding GI history and clinical presentation.   Past Medical History:  Diagnosis Date   Arterial thrombosis (HCC)    BPPV (benign paroxysmal positional vertigo)    Family history of adverse reaction to anesthesia    Patients mother had headaches and vomiting   Fracture of wrist    as a child   Mediastinal mass    Plantar fasciitis of left foot    Rosacea     Past Surgical History:  Procedure Laterality Date   RESECTION OF MEDIASTINAL MASS N/A 07/24/2015   Procedure: RESECTION OF MEDIASTINAL MASS;  Surgeon: Loreli Slot, MD;  Location: [facility] OR;  Service: Thoracic;  Laterality: N/A;   VIDEO ASSISTED THORACOSCOPY Left 07/24/2015   Procedure: VIDEO ASSISTED THORACOSCOPY;  Surgeon: Loreli Slot, MD;  Location: [facility] OR;  Service: Thoracic;  Laterality: Left;   WISDOM TOOTH EXTRACTION      Prior to Admission medications   Medication Sig Start Date End Date Taking? Authorizing Provider  Multiple Vitamin (MULTIVITAMIN ADULT PO)    Yes [provider]  amoxicillin-clavulanate (AUGMENTIN) 500-125 MG tablet Take 1 tablet by mouth 2 (two) times  daily. Patient not taking: Reported on 12/01/2023 10/28/17   [provider]  ferrous sulfate 325 (65 FE) MG tablet Take 325 mg by mouth daily with breakfast.    [provider]  METRONIDAZOLE, TOPICAL, 0.75 % LOTN apply to [facility] once daily as directed Patient not taking: Reported on 12/01/2023 04/19/15   [provider]    Current Outpatient Medications  Medication Sig Dispense Refill   Multiple Vitamin (MULTIVITAMIN ADULT PO)      amoxicillin-clavulanate (AUGMENTIN) 500-125 MG tablet Take 1 tablet by mouth 2 (two) times daily. (Patient not taking: Reported on 12/01/2023)  0   ferrous sulfate 325 (65 FE) MG tablet Take 325 mg by mouth daily with breakfast.     METRONIDAZOLE, TOPICAL, 0.75 % LOTN apply to [facility] once daily as directed (Patient not taking: Reported on 12/01/2023)  0   Current Facility-Administered Medications  Medication Dose Route Frequency Provider Last Rate Last Admin   0.9 %  sodium chloride infusion  500 mL Intravenous Continuous Jazlyne Gauger V, DO        Allergies as of 12/08/2023 - Review Complete 12/08/2023  Allergen Reaction Noted   Mango flavoring agent (non-screening) Hives and Other (See Comments) 07/20/2015    Family History  Problem Relation Age of Onset   Cancer Mother    Breast cancer Mother 49       and 39    Cancer Paternal Uncle     Social History   Socioeconomic History   Marital status: Married  Spouse name: Not on file   Number of children: 2   Years of education: Not on file   Highest education level: Not on file  Occupational History   Occupation: homemaker  Tobacco Use   Smoking status: Never   Smokeless tobacco: Not on file  Substance and Sexual Activity   Alcohol use: Yes    Alcohol/week: 0.0 standard drinks of alcohol    Comment: social   Drug use: No   Sexual activity: Not on file  Other Topics Concern   Not on file  Social History Narrative   Not on file   Social Drivers of Health    Financial Resource Strain: Not on file  Food Insecurity: Not on file  Transportation Needs: Not on file  Physical Activity: Not on file  Stress: Not on file  Social Connections: Not on file  Intimate Partner Violence: Not on file    Physical Exam: Vital signs in last 24 hours: @BP  (!) 140/98   Pulse (!) 106   Temp 98 F (36.7 C)   Resp 20   LMP 06/15/2023   SpO2 96%  GEN: NAD EYE: Sclerae anicteric ENT: MMM CV: Non-tachycardic Pulm: CTA b/l GI: Soft, NT/ND NEURO:  Alert & Oriented x 3   Doristine Locks, DO [facility]   12/08/2023 7:55 AM

## 2023-12-08 NOTE — Op Note (Signed)
 [facility] Patient Name: Erica Browning Procedure Date: 12/08/2023 7:33 AM MRN: [id number] Endoscopist: Doristine Locks , MD, [id number] Age: 53 Referring MD:  Date of Birth: 1971-07-14 Gender: Female Account #: [id number] Procedure:                Colonoscopy Indications:              This is the patient's first colonoscopy, Iron                            deficiency anemia Medicines:                Monitored Anesthesia Care Procedure:                Pre-Anesthesia Assessment:                           - Prior to the procedure, a History and Physical                            was performed, and patient medications and                            allergies were reviewed. The patient's tolerance of                            previous anesthesia was also reviewed. The risks                            and benefits of the procedure and the sedation                            options and risks were discussed with the patient.                            All questions were answered, and informed consent                            was obtained. Prior Anticoagulants: The patient has                            taken no anticoagulant or antiplatelet agents. ASA                            Grade Assessment: II - A patient with mild systemic                            disease. After reviewing the risks and benefits,                            the patient was deemed in satisfactory condition to                            undergo the procedure.  After obtaining informed consent, the colonoscope                            was passed under direct vision. Throughout the                            procedure, the patient's blood pressure, pulse, and                            oxygen saturations were monitored continuously. The                            CF [id number] #[id number] was introduced through the anus                            and advanced to the the terminal  ileum. The                            colonoscopy was performed without difficulty. The                            patient tolerated the procedure well. The quality                            of the bowel preparation was excellent. The                            terminal ileum, ileocecal valve, appendiceal                            orifice, and rectum were photographed. Scope In: 8:15:58 AM Scope Out: 8:31:36 AM Scope Withdrawal Time: 0 hours 13 minutes 47 seconds  Total Procedure Duration: 0 hours 15 minutes 38 seconds  Findings:                 The perianal and digital rectal examinations were                            normal.                           Two sessile polyps were found in the cecum. The                            polyps were 2 to 6 mm in size. These polyps were                            removed with a cold snare. Resection and retrieval                            were complete. Estimated blood loss was minimal.                           The exam was otherwise normal throughout the  remainder of the colon.                           The retroflexed view of the distal rectum and anal                            verge was normal and showed no anal or rectal                            abnormalities.                           The terminal ileum appeared normal. Complications:            No immediate complications. Estimated Blood Loss:     Estimated blood loss was minimal. Impression:               - Two 2 to 6 mm polyps in the cecum, removed with a                            cold snare. Resected and retrieved.                           - The distal rectum and anal verge are normal on                            retroflexion view.                           - The examined portion of the ileum was normal. Recommendation:           - Patient has a contact number available for                            emergencies. The signs and symptoms of potential                             delayed complications were discussed with the                            patient. Return to normal activities tomorrow.                            Written discharge instructions were provided to the                            patient.                           - Resume previous diet.                           - Continue present medications.                           - Await pathology results.                           -  Repeat colonoscopy for surveillance based on                            pathology results.                           - Return to GI office PRN. Doristine Locks, MD 12/08/2023 8:39:33 AM

## 2023-12-08 NOTE — Progress Notes (Signed)
 Agree with the assessment and plan as outlined by Boone Master, PA-C.  Nimrat Woolworth, DO, [facility]

## 2023-12-08 NOTE — Op Note (Signed)
 [facility] Patient Name: Erica Browning Procedure Date: 12/08/2023 7:34 AM MRN: [id number] Endoscopist: Doristine Locks , MD, [id number] Age: 53 Referring MD:  Date of Birth: 1970/12/27 Gender: Female Account #: [id number] Procedure:                Upper GI endoscopy Indications:              Upper abdominal pain, Iron deficiency anemia Medicines:                Monitored Anesthesia Care Procedure:                Pre-Anesthesia Assessment:                           - Prior to the procedure, a History and Physical                            was performed, and patient medications and                            allergies were reviewed. The patient's tolerance of                            previous anesthesia was also reviewed. The risks                            and benefits of the procedure and the sedation                            options and risks were discussed with the patient.                            All questions were answered, and informed consent                            was obtained. Prior Anticoagulants: The patient has                            taken no anticoagulant or antiplatelet agents. ASA                            Grade Assessment: II - A patient with mild systemic                            disease. After reviewing the risks and benefits,                            the patient was deemed in satisfactory condition to                            undergo the procedure.                           After obtaining informed consent, the endoscope was  passed under direct vision. Throughout the                            procedure, the patient's blood pressure, pulse, and                            oxygen saturations were monitored continuously. The                            Olympus Scope [id number] was introduced through the                            mouth, and advanced to the second part of duodenum.                             The upper GI endoscopy was accomplished without                            difficulty. The patient tolerated the procedure                            well. Scope In: Scope Out: Findings:                 The examined esophagus was normal.                           The Z-line was regular and was found 35 cm from the                            incisors.                           The entire examined stomach was normal. Biopsies                            were taken with a cold forceps for Helicobacter                            pylori testing. Estimated blood loss was minimal.                           The examined duodenum was normal. Biopsies were                            taken with a cold forceps for histology. Estimated                            blood loss was minimal. Complications:            No immediate complications. Estimated Blood Loss:     Estimated blood loss was minimal. Impression:               - Normal esophagus.                           -  Z-line regular, 35 cm from the incisors.                           - Normal stomach. Biopsied.                           - Normal examined duodenum. Biopsied. Recommendation:           - Patient has a contact number available for                            emergencies. The signs and symptoms of potential                            delayed complications were discussed with the                            patient. Return to normal activities tomorrow.                            Written discharge instructions were provided to the                            patient.                           - Resume previous diet.                           - Continue present medications.                           - Await pathology results. Doristine Locks, MD 12/08/2023 8:36:20 AM

## 2023-12-08 NOTE — Patient Instructions (Signed)
 Please read handouts provided. Continue present medications. Await pathology results. Resume previous diet. Return to GI office as needed.   YOU HAD AN ENDOSCOPIC PROCEDURE TODAY AT THE [facility]:   Refer to the procedure report that was given to you for any specific questions about what was found during the examination.  If the procedure report does not answer your questions, please call your gastroenterologist to clarify.  If you requested that your care partner not be given the details of your procedure findings, then the procedure report has been included in a sealed envelope for you to review at your convenience later.  YOU SHOULD EXPECT: Some feelings of bloating in the abdomen. Passage of more gas than usual.  Walking can help get rid of the air that was put into your GI tract during the procedure and reduce the bloating. If you had a lower endoscopy (such as a colonoscopy or flexible sigmoidoscopy) you may notice spotting of blood in your stool or on the toilet paper. If you underwent a bowel prep for your procedure, you may not have a normal bowel movement for a few days.  Please Note:  You might notice some irritation and congestion in your nose or some drainage.  This is from the oxygen used during your procedure.  There is no need for concern and it should clear up in a day or so.  SYMPTOMS TO REPORT IMMEDIATELY:  Following lower endoscopy (colonoscopy or flexible sigmoidoscopy):  Excessive amounts of blood in the stool  Significant tenderness or worsening of abdominal pains  Swelling of the abdomen that is new, acute  Fever of 100F or higher  Following upper endoscopy (EGD)  Vomiting of blood or coffee ground material  New chest pain or pain under the shoulder blades  Painful or persistently difficult swallowing  New shortness of breath  Fever of 100F or higher  Black, tarry-looking stools  For urgent or emergent issues, a gastroenterologist can be reached  at any hour by calling [phone]. Do not use MyChart messaging for urgent concerns.    DIET:  We do recommend a small meal at first, but then you may proceed to your regular diet.  Drink plenty of fluids but you should avoid alcoholic beverages for 24 hours.  ACTIVITY:  You should plan to take it easy for the rest of today and you should NOT DRIVE or use heavy machinery until tomorrow (because of the sedation medicines used during the test).    FOLLOW UP: Our staff will call the number listed on your records the next business day following your procedure.  We will call around 7:15- 8:00 am to check on you and address any questions or concerns that you may have regarding the information given to you following your procedure. If we do not reach you, we will leave a message.     If any biopsies were taken you will be contacted by phone or by letter within the next 1-3 weeks.  Please call us at [phone] if you have not heard about the biopsies in 3 weeks.    SIGNATURES/CONFIDENTIALITY: You and/or your care partner have signed paperwork which will be entered into your electronic medical record.  These signatures attest to the fact that that the information above on your After Visit Summary has been reviewed and is understood.  Full responsibility of the confidentiality of this discharge information lies with you and/or your care-partner.

## 2023-12-09 ENCOUNTER — Telehealth: Payer: Self-pay | Admitting: *Deleted

## 2023-12-09 DIAGNOSIS — K8020 Calculus of gallbladder without cholecystitis without obstruction: Secondary | ICD-10-CM | POA: Diagnosis not present

## 2023-12-09 NOTE — Telephone Encounter (Signed)
  Follow up Call-     12/08/2023    7:30 AM  Call back number  Post procedure Call Back phone  # [phone]  Permission to leave phone message Yes     Patient questions:  Do you have a fever, pain , or abdominal swelling? No. Pain Score  0 *  Have you tolerated food without any problems? Yes.    Have you been able to return to your normal activities? Yes.    Do you have any questions about your discharge instructions: Diet   No. Medications  No. Follow up visit  No.  Do you have questions or concerns about your Care? No.  Actions: * If pain score is 4 or above: No action needed, pain <4.

## 2023-12-10 DIAGNOSIS — Z01419 Encounter for gynecological examination (general) (routine) without abnormal findings: Secondary | ICD-10-CM | POA: Diagnosis not present

## 2023-12-10 DIAGNOSIS — Z1331 Encounter for screening for depression: Secondary | ICD-10-CM | POA: Diagnosis not present

## 2023-12-10 LAB — SURGICAL PATHOLOGY

## 2023-12-11 ENCOUNTER — Encounter: Payer: Self-pay | Admitting: Gastroenterology

## 2024-01-29 ENCOUNTER — Other Ambulatory Visit (HOSPITAL_COMMUNITY)

## 2024-01-29 ENCOUNTER — Ambulatory Visit: Payer: Self-pay | Admitting: General Surgery

## 2024-01-29 NOTE — Pre-Procedure Instructions (Signed)
 Surgical Instructions   Your procedure is scheduled on Thursday, April 24th. Report to [facility] Main Entrance "A" at 05:30 A.M., then check in with the Admitting office. Any questions or running late day of surgery: call [phone]  Questions prior to your surgery date: call [phone], Monday-Friday, 8am-4pm. If you experience any cold or flu symptoms such as cough, fever, chills, shortness of breath, etc. between now and your scheduled surgery, please notify us at the above number.     Remember:  Do not eat after midnight the night before your surgery   You may drink clear liquids until 04:30 AM the morning of your surgery.   Clear liquids allowed are: Water, Non-Citrus Juices (without pulp), Carbonated Beverages, Clear Tea (no milk, honey, etc.), Black Coffee Only (NO MILK, CREAM OR POWDERED CREAMER of any kind), and Gatorade.    Take these medicines the morning of surgery with A SIP OF WATER: NONE    One week prior to surgery, STOP taking any Aspirin (unless otherwise instructed by your surgeon) Aleve, Naproxen, Ibuprofen, Motrin, Advil, Goody's, BC's, all herbal medications, fish oil, and non-prescription vitamins.                     Do NOT Smoke (Tobacco/Vaping) for 24 hours prior to your procedure.  If you use a CPAP at night, you may bring your mask/headgear for your overnight stay.   You will be asked to remove any contacts, glasses, piercing's, hearing aid's, dentures/partials prior to surgery. Please bring cases for these items if needed.    Patients discharged the day of surgery will not be allowed to drive home, and someone needs to stay with them for 24 hours.  SURGICAL WAITING ROOM VISITATION Patients may have no more than 2 support people in the waiting area - these visitors may rotate.   Pre-op nurse will coordinate an appropriate time for 1 ADULT support person, who may not rotate, to accompany patient in pre-op.  Children under the age of 15 must have an  adult with them who is not the patient and must remain in the main waiting area with an adult.  If the patient needs to stay at the hospital during part of their recovery, the visitor guidelines for inpatient rooms apply.  Please refer to the [facility] website for the visitor guidelines for any additional information.   If you received a COVID test during your pre-op visit  it is requested that you wear a mask when out in public, stay away from anyone that may not be feeling well and notify your surgeon if you develop symptoms. If you have been in contact with anyone that has tested positive in the last 10 days please notify you surgeon.      Pre-operative CHG Bathing Instructions   You can play a key role in reducing the risk of infection after surgery. Your skin needs to be as free of germs as possible. You can reduce the number of germs on your skin by washing with CHG (chlorhexidine gluconate) soap before surgery. CHG is an antiseptic soap that kills germs and continues to kill germs even after washing.   DO NOT use if you have an allergy to chlorhexidine/CHG or antibacterial soaps. If your skin becomes reddened or irritated, stop using the CHG and notify one of our RNs at [phone].              TAKE A SHOWER THE NIGHT BEFORE SURGERY AND THE DAY OF  SURGERY    Please keep in mind the following:  DO NOT shave, including legs and underarms, 48 hours prior to surgery.   You may shave your face before/day of surgery.  Place clean sheets on your bed the night before surgery Use a clean washcloth (not used since being washed) for each shower. DO NOT sleep with pet's night before surgery.  CHG Shower Instructions:  Wash your face and private area with normal soap. If you choose to wash your hair, wash first with your normal shampoo.  After you use shampoo/soap, rinse your hair and body thoroughly to remove shampoo/soap residue.  Turn the water OFF and apply half the bottle of CHG soap  to a CLEAN washcloth.  Apply CHG soap ONLY FROM YOUR NECK DOWN TO YOUR TOES (washing for 3-5 minutes)  DO NOT use CHG soap on face, private areas, open wounds, or sores.  Pay special attention to the area where your surgery is being performed.  If you are having back surgery, having someone wash your back for you may be helpful. Wait 2 minutes after CHG soap is applied, then you may rinse off the CHG soap.  Pat dry with a clean towel  Put on clean pajamas    Additional instructions for the day of surgery: DO NOT APPLY any lotions, deodorants, cologne, or perfumes.   Do not wear jewelry or makeup Do not wear nail polish, gel polish, artificial nails, or any other type of covering on natural nails (fingers and toes) Do not bring valuables to the hospital. [facility] is not responsible for valuables/personal belongings. Put on clean/comfortable clothes.  Please brush your teeth.  Ask your nurse before applying any prescription medications to the skin.

## 2024-02-02 ENCOUNTER — Encounter (HOSPITAL_COMMUNITY): Payer: Self-pay

## 2024-02-02 ENCOUNTER — Encounter (HOSPITAL_COMMUNITY)
Admission: RE | Admit: 2024-02-02 | Discharge: 2024-02-02 | Disposition: A | Discharge status: Home / Self Care | Source: Ambulatory Visit | Attending: General Surgery | Admitting: General Surgery

## 2024-02-02 ENCOUNTER — Other Ambulatory Visit: Payer: Self-pay

## 2024-02-02 VITALS — BP 150/95 | HR 95 | Temp 98.3°F | Ht 67.0 in | Wt 192.6 lb

## 2024-02-02 DIAGNOSIS — I10 Essential (primary) hypertension: Secondary | ICD-10-CM | POA: Insufficient documentation

## 2024-02-02 DIAGNOSIS — Z01818 Encounter for other preprocedural examination: Secondary | ICD-10-CM | POA: Insufficient documentation

## 2024-02-02 DIAGNOSIS — K828 Other specified diseases of gallbladder: Secondary | ICD-10-CM | POA: Diagnosis not present

## 2024-02-02 DIAGNOSIS — E669 Obesity, unspecified: Secondary | ICD-10-CM | POA: Diagnosis not present

## 2024-02-02 DIAGNOSIS — Z6830 Body mass index (BMI) 30.0-30.9, adult: Secondary | ICD-10-CM | POA: Diagnosis not present

## 2024-02-02 DIAGNOSIS — K8010 Calculus of gallbladder with chronic cholecystitis without obstruction: Secondary | ICD-10-CM | POA: Diagnosis not present

## 2024-02-02 LAB — BASIC METABOLIC PANEL WITH GFR
Anion gap: 7 (ref 5–15)
BUN: 12 mg/dL (ref 6–20)
CO2: 25 mmol/L (ref 22–32)
Calcium: 9.8 mg/dL (ref 8.9–10.3)
Chloride: 106 mmol/L (ref 98–111)
Creatinine, Ser: 0.76 mg/dL (ref 0.44–1.00)
GFR, Estimated: >60 mL/min (ref >60)
Glucose, Bld: 111 mg/dL — ABNORMAL HIGH (ref 70–99)
Potassium: 4 mmol/L (ref 3.5–5.1)
Sodium: 138 mmol/L (ref 135–145)

## 2024-02-02 LAB — CBC
HCT: 48.4 % — ABNORMAL HIGH (ref 36.0–46.0)
Hemoglobin: 16.1 g/dL — ABNORMAL HIGH (ref 12.0–15.0)
MCH: 31.2 pg (ref 26.0–34.0)
MCHC: 33.3 g/dL (ref 30.0–36.0)
MCV: 93.8 fL (ref 80.0–100.0)
Platelets: 277 10*3/uL (ref 150–400)
RBC: 5.16 MIL/uL — ABNORMAL HIGH (ref 3.87–5.11)
RDW: 13.1 % (ref 11.5–15.5)
WBC: 4.7 10*3/uL (ref 4.0–10.5)
nRBC: 0 % (ref 0.0–0.2)

## 2024-02-02 NOTE — Progress Notes (Signed)
 PCP - Dr. Kristina Pfeiffer. Cardiologist - denies  PPM/ICD - denies   Chest x-ray - 02/13/16 EKG - 02/02/24 Stress Test - denies ECHO - 06/29/15 Cardiac Cath - denies  Sleep Study - denies   DM- denies  Last dose of GLP1 agonist-  n/a   ASA/Blood Thinner Instructions: n/a   ERAS Protcol - clears until 0430   COVID TEST- n/a   Anesthesia review: no. Pt's BP was high, but 150/95 when rechecked manually. EKG obtained.  Patient denies shortness of breath, fever, cough and chest pain at PAT appointment   All instructions explained to the patient, with a verbal understanding of the material. Patient agrees to go over the instructions while at home for a better understanding. The opportunity to ask questions was provided.

## 2024-02-03 ENCOUNTER — Ambulatory Visit: Payer: Self-pay | Admitting: General Surgery

## 2024-02-03 NOTE — H&P (View-Only) (Signed)
 HPI  Erica Browning is a 53 y.o. female was seen in clinic on 12/09/23 for symptomatic cholelithiasis.   Patient states she has had several years of symptoms from gallstones. She reports having pain in epigastrium and RUQ every few weeks to months associated with eating fatty/greasy foods. This resolves after an hour or so if she lies down or if she gets up and walks.    She denies fevers/chills. No changes in bowel habits. No change in color of urine or stools.   Patient had US  showing cholelithiasis without signs of cholecystitis.    Patient has not had previous abdominal surgery, she has had VATs for resection of mediastinal teratoma in 2016.    Positive hemoccult blood test recently prompting EGD and colonoscopy on 12/08/23. Two polyps were removed from the cecum and biopsies taken from stomach and duodenum to test for H pylori. Biopsy results pending.  10 point review of systems is negative except as listed above in HPI.  Objective  Past Medical History: Past Medical History:  Diagnosis Date   Arterial thrombosis (HCC)    BPPV (benign paroxysmal positional vertigo)    Family history of adverse reaction to anesthesia    Patients mother had headaches and vomiting   Fracture of wrist    as a child   Mediastinal mass    Plantar fasciitis of left foot    Rosacea     Past Surgical History: Past Surgical History:  Procedure Laterality Date   RESECTION OF MEDIASTINAL MASS N/A 07/24/2015   Procedure: RESECTION OF MEDIASTINAL MASS;  Surgeon: Zelphia Higashi, MD;  Location: [facility] OR;  Service: Thoracic;  Laterality: N/A;   VIDEO ASSISTED THORACOSCOPY Left 07/24/2015   Procedure: VIDEO ASSISTED THORACOSCOPY;  Surgeon: Zelphia Higashi, MD;  Location: [facility] OR;  Service: Thoracic;  Laterality: Left;   WISDOM TOOTH EXTRACTION      Family History:  Family History  Problem Relation Age of Onset   Cancer Mother    Breast cancer Mother 69       and 106    Cancer Paternal Uncle      Social History:  reports that she has never smoked. She has never used smokeless tobacco. She reports current alcohol  use of about 3.0 standard drinks of alcohol  per week. She reports that she does not use drugs.  Allergies:  Allergies  Allergen Reactions   Mango Flavoring Agent (Non-Screening) Hives and Other (See Comments)    MANGO SAP   Tape Rash    When left on too long    Medications: I have reviewed the patient's current medications.  Labs: Pertinent lab work personally reviewed from 02/02/24.  Imaging: Pertinent imaging personally reviewed  US  RUQ 12/03/23: Cholelithiasis without gallbladder wall thickening or pericholecystic fluid. Negative sonographic Murphy's sign.   Physical Exam Last menstrual period 06/24/2023. General: No acute distress, well appearing HEENT: PERRL, hearing grossly normal, mucous membranes moist CV: Regular rate and rhythm Pulm: Normal work of breathing on room air Abd: Soft, nontender, nondistended Extremities: Warm and well perfused Neuro: A&O x4, no focal neurologic deficits Psych: Appropriate mood and effect  Assessment   Erica Browning is an 53 y.o. female with symptomatic cholelithiasis  Plan  - Proceed to OR for laparoscopic cholecystectomy  - We discussed the etiology of patient's pain, we discussed treatment options and recommended surgery. We discussed details of surgery including general anesthesia, laparoscopic approach, identification of cystic duct and common bile duct. Ligation of cystic duct and  cystic artery. Possible need for intraoperative cholangiogram, open procedure, and subtotal cholecystectomy. Possible risks of common bile duct injury, injury to surrounding structures, bile leak, bleeding, infection, diarrhea, retained stone and hernia. The patient showed good understanding and all questions were answered.   Freddrick Jaffe, MD [facility]

## 2024-02-03 NOTE — H&P (Signed)
 HPI  Erica Browning is a 53 y.o. female was seen in clinic on 12/09/23 for symptomatic cholelithiasis.   Patient states she has had several years of symptoms from gallstones. She reports having pain in epigastrium and RUQ every few weeks to months associated with eating fatty/greasy foods. This resolves after an hour or so if she lies down or if she gets up and walks.    She denies fevers/chills. No changes in bowel habits. No change in color of urine or stools.   Patient had US  showing cholelithiasis without signs of cholecystitis.    Patient has not had previous abdominal surgery, she has had VATs for resection of mediastinal teratoma in 2016.    Positive hemoccult blood test recently prompting EGD and colonoscopy on 12/08/23. Two polyps were removed from the cecum and biopsies taken from stomach and duodenum to test for H pylori. Biopsy results pending.  10 point review of systems is negative except as listed above in HPI.  Objective  Past Medical History: Past Medical History:  Diagnosis Date   Arterial thrombosis (HCC)    BPPV (benign paroxysmal positional vertigo)    Family history of adverse reaction to anesthesia    Patients mother had headaches and vomiting   Fracture of wrist    as a child   Mediastinal mass    Plantar fasciitis of left foot    Rosacea     Past Surgical History: Past Surgical History:  Procedure Laterality Date   RESECTION OF MEDIASTINAL MASS N/A 07/24/2015   Procedure: RESECTION OF MEDIASTINAL MASS;  Surgeon: Zelphia Higashi, MD;  Location: [facility] OR;  Service: Thoracic;  Laterality: N/A;   VIDEO ASSISTED THORACOSCOPY Left 07/24/2015   Procedure: VIDEO ASSISTED THORACOSCOPY;  Surgeon: Zelphia Higashi, MD;  Location: [facility] OR;  Service: Thoracic;  Laterality: Left;   WISDOM TOOTH EXTRACTION      Family History:  Family History  Problem Relation Age of Onset   Cancer Mother    Breast cancer Mother 69       and 106    Cancer Paternal Uncle      Social History:  reports that she has never smoked. She has never used smokeless tobacco. She reports current alcohol  use of about 3.0 standard drinks of alcohol  per week. She reports that she does not use drugs.  Allergies:  Allergies  Allergen Reactions   Mango Flavoring Agent (Non-Screening) Hives and Other (See Comments)    MANGO SAP   Tape Rash    When left on too long    Medications: I have reviewed the patient's current medications.  Labs: Pertinent lab work personally reviewed from 02/02/24.  Imaging: Pertinent imaging personally reviewed  US  RUQ 12/03/23: Cholelithiasis without gallbladder wall thickening or pericholecystic fluid. Negative sonographic Murphy's sign.   Physical Exam Last menstrual period 06/24/2023. General: No acute distress, well appearing HEENT: PERRL, hearing grossly normal, mucous membranes moist CV: Regular rate and rhythm Pulm: Normal work of breathing on room air Abd: Soft, nontender, nondistended Extremities: Warm and well perfused Neuro: A&O x4, no focal neurologic deficits Psych: Appropriate mood and effect  Assessment   Erica Browning is an 53 y.o. female with symptomatic cholelithiasis  Plan  - Proceed to OR for laparoscopic cholecystectomy  - We discussed the etiology of patient's pain, we discussed treatment options and recommended surgery. We discussed details of surgery including general anesthesia, laparoscopic approach, identification of cystic duct and common bile duct. Ligation of cystic duct and  cystic artery. Possible need for intraoperative cholangiogram, open procedure, and subtotal cholecystectomy. Possible risks of common bile duct injury, injury to surrounding structures, bile leak, bleeding, infection, diarrhea, retained stone and hernia. The patient showed good understanding and all questions were answered.   Freddrick Jaffe, MD [facility]

## 2024-02-05 ENCOUNTER — Encounter (HOSPITAL_COMMUNITY)
Admission: RE | Disposition: A | Discharge status: Home / Self Care | Payer: Self-pay | Source: Home / Self Care | Attending: General Surgery

## 2024-02-05 ENCOUNTER — Other Ambulatory Visit: Payer: Self-pay

## 2024-02-05 ENCOUNTER — Encounter (HOSPITAL_COMMUNITY): Payer: Self-pay | Admitting: General Surgery

## 2024-02-05 ENCOUNTER — Ambulatory Visit (HOSPITAL_COMMUNITY)
Admission: RE | Admit: 2024-02-05 | Discharge: 2024-02-05 | Disposition: A | Discharge status: Home / Self Care | Payer: BC Managed Care – PPO | Attending: General Surgery | Admitting: General Surgery

## 2024-02-05 ENCOUNTER — Ambulatory Visit (HOSPITAL_COMMUNITY): Payer: Self-pay

## 2024-02-05 ENCOUNTER — Ambulatory Visit (HOSPITAL_COMMUNITY)

## 2024-02-05 DIAGNOSIS — K8010 Calculus of gallbladder with chronic cholecystitis without obstruction: Secondary | ICD-10-CM | POA: Insufficient documentation

## 2024-02-05 DIAGNOSIS — K828 Other specified diseases of gallbladder: Secondary | ICD-10-CM | POA: Diagnosis not present

## 2024-02-05 DIAGNOSIS — Z01818 Encounter for other preprocedural examination: Secondary | ICD-10-CM

## 2024-02-05 DIAGNOSIS — Z6830 Body mass index (BMI) 30.0-30.9, adult: Secondary | ICD-10-CM | POA: Diagnosis not present

## 2024-02-05 DIAGNOSIS — E669 Obesity, unspecified: Secondary | ICD-10-CM | POA: Insufficient documentation

## 2024-02-05 HISTORY — PX: CHOLECYSTECTOMY: SHX55

## 2024-02-05 LAB — POCT PREGNANCY, URINE: Preg Test, Ur: NEGATIVE

## 2024-02-05 SURGERY — LAPAROSCOPIC CHOLECYSTECTOMY WITH INTRAOPERATIVE CHOLANGIOGRAM
Anesthesia: General | Site: Abdomen

## 2024-02-05 MED ORDER — FENTANYL CITRATE (PF) 250 MCG/5ML IJ SOLN
INTRAMUSCULAR | Status: DC | PRN
  Administered 2024-02-05: 50 ug via INTRAVENOUS
  Administered 2024-02-05: 100 ug via INTRAVENOUS

## 2024-02-05 MED ORDER — CELECOXIB 200 MG PO CAPS
200.0000 mg | ORAL_CAPSULE | ORAL | Status: AC
  Administered 2024-02-05: 200 mg via ORAL
  Filled 2024-02-05: qty 1

## 2024-02-05 MED ORDER — ALBUTEROL SULFATE HFA 108 (90 BASE) MCG/ACT IN AERS
INHALATION_SPRAY | RESPIRATORY_TRACT | Status: DC | PRN
  Administered 2024-02-05: 4 via RESPIRATORY_TRACT

## 2024-02-05 MED ORDER — PHENYLEPHRINE HCL-NACL 20-0.9 MG/250ML-% IV SOLN
INTRAVENOUS | Status: AC
Start: 2024-02-05 — End: ?
  Filled 2024-02-05: qty 250

## 2024-02-05 MED ORDER — KETOROLAC TROMETHAMINE 30 MG/ML IJ SOLN: 30.0000 mg | Freq: Once | INTRAMUSCULAR | Status: DC | PRN

## 2024-02-05 MED ORDER — ACETAMINOPHEN 500 MG PO TABS
1000.0000 mg | ORAL_TABLET | ORAL | Status: AC
  Administered 2024-02-05: 1000 mg via ORAL
  Filled 2024-02-05: qty 2

## 2024-02-05 MED ORDER — MEPERIDINE HCL 25 MG/ML IJ SOLN: 6.2500 mg | INTRAMUSCULAR | Status: DC | PRN

## 2024-02-05 MED ORDER — DEXAMETHASONE SODIUM PHOSPHATE 10 MG/ML IJ SOLN
INTRAMUSCULAR | Status: AC
  Filled 2024-02-05: qty 1

## 2024-02-05 MED ORDER — HYDROMORPHONE HCL 1 MG/ML IJ SOLN
INTRAMUSCULAR | Status: DC
Start: 2024-02-05 — End: 2024-02-05
  Filled 2024-02-05: qty 1

## 2024-02-05 MED ORDER — HYDROMORPHONE HCL 1 MG/ML IJ SOLN
0.2500 mg | INTRAMUSCULAR | Status: DC | PRN
Start: 2024-02-05 — End: 2024-02-05
  Administered 2024-02-05: 0.25 mg via INTRAVENOUS
  Administered 2024-02-05: 0.5 mg via INTRAVENOUS
  Administered 2024-02-05: 0.25 mg via INTRAVENOUS

## 2024-02-05 MED ORDER — ORAL CARE MOUTH RINSE: 15.0000 mL | Freq: Once | OROMUCOSAL | Status: AC

## 2024-02-05 MED ORDER — OXYCODONE HCL 5 MG PO TABS: 5.0000 mg | ORAL_TABLET | ORAL | 0 refills | Status: AC | PRN

## 2024-02-05 MED ORDER — OXYCODONE HCL 5 MG/5ML PO SOLN: 5.0000 mg | Freq: Once | ORAL | Status: AC | PRN

## 2024-02-05 MED ORDER — CHLORHEXIDINE GLUCONATE CLOTH 2 % EX PADS: 6.0000 | MEDICATED_PAD | Freq: Once | CUTANEOUS | Status: DC

## 2024-02-05 MED ORDER — BUPIVACAINE-EPINEPHRINE (PF) 0.25% -1:200000 IJ SOLN
INTRAMUSCULAR | Status: AC
  Filled 2024-02-05: qty 30

## 2024-02-05 MED ORDER — OXYCODONE HCL 5 MG PO TABS
ORAL_TABLET | ORAL | Status: AC
  Filled 2024-02-05: qty 1

## 2024-02-05 MED ORDER — CEFAZOLIN SODIUM-DEXTROSE 2-4 GM/100ML-% IV SOLN
2.0000 g | INTRAVENOUS | Status: AC
  Administered 2024-02-05: 2 g via INTRAVENOUS
  Filled 2024-02-05: qty 100

## 2024-02-05 MED ORDER — FENTANYL CITRATE (PF) 250 MCG/5ML IJ SOLN
INTRAMUSCULAR | Status: AC
  Filled 2024-02-05: qty 5

## 2024-02-05 MED ORDER — HEMOSTATIC AGENTS (NO CHARGE) OPTIME
TOPICAL | Status: DC | PRN
  Administered 2024-02-05: 1 via TOPICAL

## 2024-02-05 MED ORDER — ONDANSETRON HCL 4 MG/2ML IJ SOLN: 4.0000 mg | Freq: Once | INTRAMUSCULAR | Status: DC | PRN

## 2024-02-05 MED ORDER — ENOXAPARIN SODIUM 40 MG/0.4ML IJ SOSY
40.0000 mg | PREFILLED_SYRINGE | Freq: Once | INTRAMUSCULAR | Status: AC
  Administered 2024-02-05: 40 mg via SUBCUTANEOUS
  Filled 2024-02-05: qty 0.4

## 2024-02-05 MED ORDER — 0.9 % SODIUM CHLORIDE (POUR BTL) OPTIME
TOPICAL | Status: DC | PRN
  Administered 2024-02-05: 1000 mL

## 2024-02-05 MED ORDER — AMISULPRIDE (ANTIEMETIC) 5 MG/2ML IV SOLN
10.0000 mg | Freq: Once | INTRAVENOUS | Status: AC | PRN
  Administered 2024-02-05: 10 mg via INTRAVENOUS

## 2024-02-05 MED ORDER — KETAMINE HCL 10 MG/ML IJ SOLN
INTRAMUSCULAR | Status: DC | PRN
  Administered 2024-02-05: 10 mg via INTRAVENOUS
  Administered 2024-02-05: 20 mg via INTRAVENOUS
  Administered 2024-02-05 (×2): 10 mg via INTRAVENOUS

## 2024-02-05 MED ORDER — ONDANSETRON HCL 4 MG/2ML IJ SOLN
INTRAMUSCULAR | Status: AC
  Filled 2024-02-05: qty 2

## 2024-02-05 MED ORDER — GABAPENTIN 300 MG PO CAPS
300.0000 mg | ORAL_CAPSULE | ORAL | Status: AC
Start: 2024-02-05 — End: 2024-02-05
  Administered 2024-02-05: 300 mg via ORAL
  Filled 2024-02-05: qty 1

## 2024-02-05 MED ORDER — LIDOCAINE 2% (20 MG/ML) 5 ML SYRINGE
INTRAMUSCULAR | Status: AC
  Filled 2024-02-05: qty 5

## 2024-02-05 MED ORDER — PHENYLEPHRINE HCL-NACL 20-0.9 MG/250ML-% IV SOLN
INTRAVENOUS | Status: DC | PRN
  Administered 2024-02-05: 25 ug/min via INTRAVENOUS

## 2024-02-05 MED ORDER — KETAMINE HCL 50 MG/5ML IJ SOSY
PREFILLED_SYRINGE | INTRAMUSCULAR | Status: AC
  Filled 2024-02-05: qty 5

## 2024-02-05 MED ORDER — PROPOFOL 10 MG/ML IV BOLUS
INTRAVENOUS | Status: AC
  Filled 2024-02-05: qty 20

## 2024-02-05 MED ORDER — ROCURONIUM BROMIDE 10 MG/ML (PF) SYRINGE
PREFILLED_SYRINGE | INTRAVENOUS | Status: AC
  Filled 2024-02-05: qty 10

## 2024-02-05 MED ORDER — ONDANSETRON HCL 4 MG/2ML IJ SOLN
INTRAMUSCULAR | Status: DC | PRN
  Administered 2024-02-05: 4 mg via INTRAVENOUS

## 2024-02-05 MED ORDER — BUPIVACAINE-EPINEPHRINE 0.25% -1:200000 IJ SOLN
INTRAMUSCULAR | Status: DC | PRN
  Administered 2024-02-05: 15 mL

## 2024-02-05 MED ORDER — SUGAMMADEX SODIUM 200 MG/2ML IV SOLN
INTRAVENOUS | Status: DC | PRN
  Administered 2024-02-05: 200 mg via INTRAVENOUS

## 2024-02-05 MED ORDER — MIDAZOLAM HCL 2 MG/2ML IJ SOLN
INTRAMUSCULAR | Status: AC
  Filled 2024-02-05: qty 2

## 2024-02-05 MED ORDER — CHLORHEXIDINE GLUCONATE 0.12 % MT SOLN
15.0000 mL | Freq: Once | OROMUCOSAL | Status: AC
  Administered 2024-02-05: 15 mL via OROMUCOSAL

## 2024-02-05 MED ORDER — LIDOCAINE 2% (20 MG/ML) 5 ML SYRINGE
INTRAMUSCULAR | Status: DC | PRN
  Administered 2024-02-05: 60 mg via INTRAVENOUS

## 2024-02-05 MED ORDER — ACETAMINOPHEN 500 MG PO TABS: 1000.0000 mg | ORAL_TABLET | Freq: Once | ORAL | Status: DC

## 2024-02-05 MED ORDER — SODIUM CHLORIDE 0.9 % IR SOLN
Status: DC | PRN
  Administered 2024-02-05: 1000 mL

## 2024-02-05 MED ORDER — LACTATED RINGERS IV SOLN: INTRAVENOUS | Status: DC

## 2024-02-05 MED ORDER — AMISULPRIDE (ANTIEMETIC) 5 MG/2ML IV SOLN
INTRAVENOUS | Status: AC
  Filled 2024-02-05: qty 4

## 2024-02-05 MED ORDER — OXYCODONE HCL 5 MG PO TABS
5.0000 mg | ORAL_TABLET | Freq: Once | ORAL | Status: AC | PRN
  Administered 2024-02-05: 5 mg via ORAL

## 2024-02-05 MED ORDER — PHENYLEPHRINE 80 MCG/ML (10ML) SYRINGE FOR IV PUSH (FOR BLOOD PRESSURE SUPPORT)
PREFILLED_SYRINGE | INTRAVENOUS | Status: DC | PRN
Start: 2024-02-05 — End: 2024-02-05
  Administered 2024-02-05: 80 ug via INTRAVENOUS
  Administered 2024-02-05: 160 ug via INTRAVENOUS

## 2024-02-05 MED ORDER — DEXAMETHASONE SODIUM PHOSPHATE 10 MG/ML IJ SOLN
INTRAMUSCULAR | Status: DC | PRN
Start: 2024-02-05 — End: 2024-02-05
  Administered 2024-02-05: 10 mg via INTRAVENOUS

## 2024-02-05 MED ORDER — MIDAZOLAM HCL 2 MG/2ML IJ SOLN
INTRAMUSCULAR | Status: DC | PRN
  Administered 2024-02-05: 2 mg via INTRAVENOUS

## 2024-02-05 MED ORDER — PROPOFOL 10 MG/ML IV BOLUS
INTRAVENOUS | Status: DC | PRN
  Administered 2024-02-05: 200 mg via INTRAVENOUS

## 2024-02-05 MED ORDER — ROCURONIUM BROMIDE 10 MG/ML (PF) SYRINGE
PREFILLED_SYRINGE | INTRAVENOUS | Status: DC | PRN
  Administered 2024-02-05: 60 mg via INTRAVENOUS
  Administered 2024-02-05 (×2): 10 mg via INTRAVENOUS

## 2024-02-05 SURGICAL SUPPLY — 43 items
BAG COUNTER SPONGE SURGICOUNT (BAG) ×1 IMPLANT
CANISTER SUCT 3000ML PPV (MISCELLANEOUS) ×1 IMPLANT
CHLORAPREP W/TINT 26 (MISCELLANEOUS) ×1 IMPLANT
CLIP LIGATING HEMO O LOK GREEN (MISCELLANEOUS) ×1 IMPLANT
CNTNR URN SCR LID CUP LEK RST (MISCELLANEOUS) ×1 IMPLANT
COVER MAYO STAND STRL (DRAPES) IMPLANT
COVER SURGICAL LIGHT HANDLE (MISCELLANEOUS) ×1 IMPLANT
DERMABOND ADVANCED .7 DNX12 (GAUZE/BANDAGES/DRESSINGS) ×1 IMPLANT
DRAPE C-ARM 42X120 X-RAY (DRAPES) IMPLANT
ELECTRODE REM PT RTRN 9FT ADLT (ELECTROSURGICAL) ×1 IMPLANT
GAUZE 4X4 16PLY ~~LOC~~+RFID DBL (SPONGE) ×1 IMPLANT
GLOVE BIOGEL PI MICRO STRL 6 (GLOVE) ×1 IMPLANT
GLOVE INDICATOR 6.5 STRL GRN (GLOVE) ×1 IMPLANT
GOWN STRL REUS W/ TWL LRG LVL3 (GOWN DISPOSABLE) ×1 IMPLANT
GRASPER SUT TROCAR 14GX15 (MISCELLANEOUS) ×1 IMPLANT
IRRIGATION SUCT STRKRFLW 2 WTP (MISCELLANEOUS) ×1 IMPLANT
KIT BASIN OR (CUSTOM PROCEDURE TRAY) ×1 IMPLANT
KIT IMAGING PINPOINTPAQ (MISCELLANEOUS) IMPLANT
KIT TURNOVER KIT B (KITS) ×1 IMPLANT
LHOOK LAP DISP 36CM (ELECTROSURGICAL) ×1 IMPLANT
NDL INSUFFLATION 14GA 120MM (NEEDLE) ×1 IMPLANT
NEEDLE INSUFFLATION 14GA 120MM (NEEDLE) ×1 IMPLANT
NS IRRIG 1000ML POUR BTL (IV SOLUTION) ×1 IMPLANT
PAD ARMBOARD POSITIONER FOAM (MISCELLANEOUS) ×1 IMPLANT
PENCIL BUTTON HOLSTER BLD 10FT (ELECTRODE) ×1 IMPLANT
POUCH LAPAROSCOPIC INSTRUMENT (MISCELLANEOUS) ×1 IMPLANT
POWDER SURGICEL 3.0 GRAM (HEMOSTASIS) IMPLANT
SCISSORS LAP 5X35 DISP (ENDOMECHANICALS) ×1 IMPLANT
SET CHOLANGIOGRAPH 5 50 .035 (SET/KITS/TRAYS/PACK) IMPLANT
SET TUBE SMOKE EVAC HIGH FLOW (TUBING) ×1 IMPLANT
SLEEVE Z-THREAD 5X100MM (TROCAR) ×2 IMPLANT
SPONGE T-LAP 18X18 ~~LOC~~+RFID (SPONGE) ×1 IMPLANT
SUT MNCRL AB 4-0 PS2 18 (SUTURE) ×1 IMPLANT
SUT VICRYL 0 UR6 27IN ABS (SUTURE) IMPLANT
SYSTEM BAG RETRIEVAL 10MM (BASKET) ×1 IMPLANT
TIP ENDOSCOPIC SURGICEL (TIP) IMPLANT
TOWEL GREEN STERILE (TOWEL DISPOSABLE) ×1 IMPLANT
TOWEL GREEN STERILE FF (TOWEL DISPOSABLE) ×1 IMPLANT
TRAY LAPAROSCOPIC MC (CUSTOM PROCEDURE TRAY) ×1 IMPLANT
TROCAR Z THREAD OPTICAL 12X100 (TROCAR) ×1 IMPLANT
TROCAR Z-THREAD OPTICAL 5X100M (TROCAR) ×1 IMPLANT
WARMER LAPAROSCOPE (MISCELLANEOUS) ×1 IMPLANT
WATER STERILE IRR 1000ML POUR (IV SOLUTION) ×1 IMPLANT

## 2024-02-05 NOTE — Op Note (Signed)
 02/05/2024 7:08 AM  PATIENT: Erica Browning  53 y.o. female  PRE-OPERATIVE DIAGNOSIS: symptomatic cholelithiasis  POST-OPERATIVE DIAGNOSIS: cholelithiasis with chronic cholecystitis  PROCEDURE: laparoscopic cholecystectomy  SURGEON: Freddrick Jaffe, MD  ASSISTANT: Woodroe Hazel, RNFA  ANESTHESIA: General endotracheal  EBL: 5cc  DRAINS: None  SPECIMEN: Gallbladder  COUNTS: Sponge, needle and instrument counts were reported correct x2 at the conclusion of the operation  DISPOSITION: PACU in satisfactory condition  COMPLICATIONS: None  FINDINGS: Chronically inflamed gallbladder with numerous large and small stones. Critical view of safety achieved prior to clipping cystic artery and duct.  DESCRIPTION:  The patient was identified & brought into the operating room. She was then positioned supine on the OR table. SCDs were in place and active during the entire case. She then underwent general endotracheal anesthesia. Pressure points were padded. Hair on the abdomen was clipped by the OR team. The abdomen was prepped and draped in the standard sterile fashion. Antibiotics were administered. A surgical timeout was performed and confirmed our plan.   A periumbilical incision was made. The umbilical stalk was grasped and retracted outwardly. A veress needle was inserted and the abdomen was insufflated to . The veress was then removed and exchanged for a 5mm trocar optiview using a 30 degree scope and the abdomen was entered under direct visualization. Inspection confirmed no evidence of trocar site complications. The patient was then positioned in reverse Trendelenburg with slight left side down. A 12 mm supxiphoid trocar was placed under direct visualization and  two additional 5mm trocars were placed along the right subcostal line - one 5mm port in mid subcostal region, another 5mm port in the right flank near the anterior axillary line.  The liver and gallbladder were inspected.  Chronically inflamed. Some omental adhesions to gallbladder noted and taken down. The gallbladder fundus was grasped and elevated cephalad. An additional grasper was then placed on the infundibulum of the gallbladder and the infundibulum was retracted laterally. Staying high on the gallbladder, the peritoneum on both sides of the gallbladder was opened with hook cautery. Gentle blunt dissection was then employed with a [place]  dissector working down into [employer]. The cystic duct was identified and carefully circumferentially dissected. The cystic artery was also identified and carefully circumferentially dissected. The space between the cystic artery and hepatocystic plate was developed such that a good view of the liver could be seen through a window medial to the cystic artery. The triangle of Calot had been cleared of all fibrofatty tissue. At this point, a critical view of safety was achieved and the only structures visualized was the skeletonized cystic duct laterally, the skeletonized cystic artery and the liver through the window medial to the artery. No posterior cystic artery was noted  The cystic duct and artery were clipped with 2 hemolock clips on the patient side and 1 clip on the specimen side. The cystic duct and artery were then divided. The gallbladder was then freed from its remaining attachments to the liver using electrocautery and placed into an endocatch bag. The RUQ was gently irrigated with sterile saline. Electrocautery used for hemostasis of liver bed as well as surgicel powder. Hemostasis was then verified. The clips were in good position; the gallbladder fossa was dry. The rest of the abdomen was inspected no injury nor bleeding elsewhere was identified.  The endocatch bag containing the gallbladder was then removed from the subxiphoid port site and passed off as specimen. The subxiphoid port fascia was then closed in a figured  of eight fashion with 0 vicryl using a suture  passer. The RUQ ports were removed under direct visualization and noted to be hemostatic.Aaron Aas The fascia was palpated and noted to be completely closed. The abdomen was then desufflated and the periumbilical trocar removed. The skin of all incision sites was approximated with 4-0 monocryl subcuticular suture and dermabond applied. She was then awakened from anesthesia, extubated, and transferred to a stretcher for transport to PACU in satisfactory condition.  Instrument, sponge, and needle counts were correct at closure and at the conclusion of the case.   Freddrick Jaffe, MD [facility]

## 2024-02-05 NOTE — Discharge Instructions (Signed)
     No ibuprofen, Advil, Aleve, Motrin, ketorolac , meloxicam, naproxen, or other NSAIDS until after 12:30 pm today if needed.  No acetaminophen /Tylenol  until after 12:30 pm today if needed.     Post Anesthesia Home Care Instructions  Activity: Get plenty of rest for the remainder of the day. A responsible individual must stay with you for 24 hours following the procedure.  For the next 24 hours, DO NOT: -Drive a car -Advertising copywriter -Drink alcoholic beverages -Take any medication unless instructed by your physician -Make any legal decisions or sign important papers.  Meals: Start with liquid foods such as gelatin or soup. Progress to regular foods as tolerated. Avoid greasy, spicy, heavy foods. If nausea and/or vomiting occur, drink only clear liquids until the nausea and/or vomiting subsides. Call your physician if vomiting continues.  Special Instructions/Symptoms: Your throat may feel dry or sore from the anesthesia or the breathing tube placed in your throat during surgery. If this causes discomfort, gargle with warm salt water. The discomfort should disappear within 24 hours.

## 2024-02-05 NOTE — Anesthesia Preprocedure Evaluation (Addendum)
 Anesthesia Evaluation  Patient identified by MRN, date of birth, ID band Patient awake    Reviewed: Allergy & Precautions, H&P , NPO status , Patient's Chart, lab work & pertinent test results  Airway Mallampati: IV  TM Distance: >3 FB Neck ROM: Full    Dental  (+) Teeth Intact, Dental Advisory Given   Pulmonary neg pulmonary ROS   Pulmonary exam normal breath sounds clear to auscultation       Cardiovascular Normal cardiovascular exam Rhythm:Regular Rate:Normal  S/p resection of mediastinal mass 2016   Neuro/Psych negative neurological ROS  negative psych ROS   GI/Hepatic negative GI ROS, Neg liver ROS,,,  Endo/Other  negative endocrine ROS  Obesity BMI 30  Renal/GU negative Renal ROS  negative genitourinary   Musculoskeletal negative musculoskeletal ROS (+)    Abdominal  (+) + obese  Peds negative pediatric ROS (+)  Hematology negative hematology ROS (+) Hb 16.1, plt 277   Anesthesia Other Findings   Reproductive/Obstetrics negative OB ROS                             Anesthesia Physical Anesthesia Plan  ASA: 2  Anesthesia Plan: General   Post-op Pain Management: Tylenol  PO (pre-op)*, Toradol  IV (intra-op)*, Ketamine  IV* and Dilaudid  IV   Induction: Intravenous  PONV Risk Score and Plan: 4 or greater and Ondansetron , Dexamethasone , Midazolam  and Treatment may vary due to age or medical condition  Airway Management Planned: Oral ETT  Additional Equipment: None  Intra-op Plan:   Post-operative Plan: Extubation in OR  Informed Consent: I have reviewed the patients History and Physical, chart, labs and discussed the procedure including the risks, benefits and alternatives for the proposed anesthesia with the patient or authorized representative who has indicated his/her understanding and acceptance.     Dental advisory given  Plan Discussed with: CRNA  Anesthesia Plan  Comments:         Anesthesia Quick Evaluation

## 2024-02-05 NOTE — Anesthesia Postprocedure Evaluation (Signed)
 Anesthesia Post Note  Patient: Erica Browning  Procedure(s) Performed: LAPAROSCOPIC CHOLECYSTECTOMY POSSIBLE INTRAOPERATIVE CHOLANGIOGRAM (Abdomen)     Patient location during evaluation: PACU Anesthesia Type: General Level of consciousness: awake and alert, oriented and patient cooperative Pain management: pain level controlled Vital Signs Assessment: post-procedure vital signs reviewed and stable Respiratory status: spontaneous breathing, nonlabored ventilation and respiratory function stable Cardiovascular status: blood pressure returned to baseline and stable Postop Assessment: no apparent nausea or vomiting Anesthetic complications: no   No notable events documented.  Last Vitals:  Vitals:   02/05/24 0930 02/05/24 0945  BP: 127/78 122/85  Pulse: 80 82  Resp: 19 (!) 25  Temp:  36.5 C  SpO2: 94% 95%    Last Pain:  Vitals:   02/05/24 0954  TempSrc:   PainSc: 4                  Jacquelyne Matte

## 2024-02-05 NOTE — Interval H&P Note (Signed)
 History and Physical Interval Note:  02/05/2024 7:05 AM  Erica Browning  has presented today for surgery, with the diagnosis of SYMPTOMATIC CHOLELITHIASIS.  The various methods of treatment have been discussed with the patient and family. After consideration of risks, benefits and other options for treatment, the patient has consented to  Procedure(s): LAPAROSCOPIC CHOLECYSTECTOMY POSSIBLE INTRAOPERATIVE CHOLANGIOGRAM (N/A) as a surgical intervention.  The patient's history has been reviewed, patient examined, no change in status, stable for surgery.  I have reviewed the patient's chart and labs.  Questions were answered to the patient's satisfaction.     Edmon Gosling

## 2024-02-05 NOTE — Transfer of Care (Signed)
 Immediate Anesthesia Transfer of Care Note  Patient: Erica Browning  Procedure(s) Performed: LAPAROSCOPIC CHOLECYSTECTOMY POSSIBLE INTRAOPERATIVE CHOLANGIOGRAM (Abdomen)  Patient Location: PACU  Anesthesia Type:General  Level of Consciousness: awake  Airway & Oxygen Therapy: Patient Spontanous Breathing and Patient connected to face mask oxygen  Post-op Assessment: Report given to RN and Post -op Vital signs reviewed and stable  Post vital signs: Reviewed and stable  Last Vitals:  Vitals Value Taken Time  BP 151/137 02/05/24 0906  Temp    Pulse 70 02/05/24 0909  Resp 16 02/05/24 0909  SpO2 100 % 02/05/24 0909  Vitals shown include unfiled device data.  Last Pain:  Vitals:   02/05/24 0726  TempSrc:   PainSc: 0-No pain         Complications: No notable events documented.

## 2024-02-05 NOTE — Anesthesia Procedure Notes (Signed)
 Procedure Name: Intubation Date/Time: 02/05/2024 7:36 AM  Performed by: Hebert Littler, CRNAPre-anesthesia Checklist: Patient identified, Emergency Drugs available, Suction available and Patient being monitored Patient Re-evaluated:Patient Re-evaluated prior to induction Oxygen Delivery Method: Circle System Utilized Preoxygenation: Pre-oxygenation with 100% oxygen Induction Type: IV induction Ventilation: Mask ventilation without difficulty Laryngoscope Size: Mac and 3 Grade View: Grade I Tube type: Oral Tube size: 7.0 mm Number of attempts: 1 Airway Equipment and Method: Stylet and Oral airway Placement Confirmation: ETT inserted through vocal cords under direct vision, positive ETCO2 and breath sounds checked- equal and bilateral Tube secured with: Tape Dental Injury: Teeth and Oropharynx as per pre-operative assessment

## 2024-02-06 ENCOUNTER — Encounter (HOSPITAL_COMMUNITY): Payer: Self-pay | Admitting: General Surgery

## 2024-02-06 LAB — SURGICAL PATHOLOGY

## 2024-07-23 DIAGNOSIS — M170 Bilateral primary osteoarthritis of knee: Secondary | ICD-10-CM | POA: Diagnosis not present

## 2024-09-27 DIAGNOSIS — M1711 Unilateral primary osteoarthritis, right knee: Secondary | ICD-10-CM | POA: Diagnosis not present

## 2024-09-27 DIAGNOSIS — M25561 Pain in right knee: Secondary | ICD-10-CM | POA: Diagnosis not present

## 2024-09-28 DIAGNOSIS — R030 Elevated blood-pressure reading, without diagnosis of hypertension: Secondary | ICD-10-CM | POA: Diagnosis not present

## 2024-09-28 DIAGNOSIS — H6121 Impacted cerumen, right ear: Secondary | ICD-10-CM | POA: Diagnosis not present
